# Patient Record
Sex: Female | Born: 1935 | Race: White | Hispanic: No | Marital: Married | Smoking: Never smoker
Health system: Southern US, Community
[De-identification: ages and names within clinical notes are randomized; demographics above are authoritative.]

## PROBLEM LIST (undated history)

## (undated) DIAGNOSIS — C801 Malignant (primary) neoplasm, unspecified: Secondary | ICD-10-CM

## (undated) DIAGNOSIS — T8859XA Other complications of anesthesia, initial encounter: Secondary | ICD-10-CM

## (undated) DIAGNOSIS — R011 Cardiac murmur, unspecified: Secondary | ICD-10-CM

## (undated) DIAGNOSIS — J189 Pneumonia, unspecified organism: Secondary | ICD-10-CM

## (undated) DIAGNOSIS — Z950 Presence of cardiac pacemaker: Secondary | ICD-10-CM

## (undated) DIAGNOSIS — K219 Gastro-esophageal reflux disease without esophagitis: Secondary | ICD-10-CM

## (undated) DIAGNOSIS — I10 Essential (primary) hypertension: Secondary | ICD-10-CM

## (undated) DIAGNOSIS — Z9889 Other specified postprocedural states: Secondary | ICD-10-CM

## (undated) DIAGNOSIS — R112 Nausea with vomiting, unspecified: Secondary | ICD-10-CM

## (undated) DIAGNOSIS — IMO0001 Reserved for inherently not codable concepts without codable children: Secondary | ICD-10-CM

## (undated) DIAGNOSIS — F419 Anxiety disorder, unspecified: Secondary | ICD-10-CM

## (undated) DIAGNOSIS — D649 Anemia, unspecified: Secondary | ICD-10-CM

## (undated) DIAGNOSIS — T4145XA Adverse effect of unspecified anesthetic, initial encounter: Secondary | ICD-10-CM

## (undated) DIAGNOSIS — G629 Polyneuropathy, unspecified: Secondary | ICD-10-CM

## (undated) DIAGNOSIS — K5790 Diverticulosis of intestine, part unspecified, without perforation or abscess without bleeding: Secondary | ICD-10-CM

## (undated) DIAGNOSIS — I209 Angina pectoris, unspecified: Secondary | ICD-10-CM

## (undated) HISTORY — PX: CATARACT EXTRACTION W/ INTRAOCULAR LENS  IMPLANT, BILATERAL: SHX1307

## (undated) HISTORY — PX: JOINT REPLACEMENT: SHX530

## (undated) HISTORY — PX: BREAST SURGERY: SHX581

## (undated) HISTORY — PX: BREAST BIOPSY: SHX20

## (undated) HISTORY — PX: EYE SURGERY: SHX253

## (undated) HISTORY — PX: ABDOMINAL HYSTERECTOMY: SHX81

## (undated) HISTORY — PX: TONSILLECTOMY: SUR1361

## (undated) HISTORY — PX: CHOLECYSTECTOMY: SHX55

---

## 2008-02-24 HISTORY — PX: HERNIA REPAIR: SHX51

## 2012-08-07 ENCOUNTER — Emergency Department: Payer: Self-pay | Admitting: Emergency Medicine

## 2012-08-07 LAB — URINALYSIS, COMPLETE
Bacteria: NONE SEEN
Bilirubin,UR: NEGATIVE
Glucose,UR: NEGATIVE mg/dL (ref 0–75)
Protein: NEGATIVE
Specific Gravity: 1.021 (ref 1.003–1.030)
WBC UR: 2 /HPF (ref 0–5)

## 2012-08-07 LAB — COMPREHENSIVE METABOLIC PANEL
Albumin: 3.6 g/dL (ref 3.4–5.0)
Alkaline Phosphatase: 64 U/L (ref 50–136)
BUN: 22 mg/dL — ABNORMAL HIGH (ref 7–18)
Bilirubin,Total: 0.4 mg/dL (ref 0.2–1.0)
Calcium, Total: 9.4 mg/dL (ref 8.5–10.1)
Chloride: 107 mmol/L (ref 98–107)
Creatinine: 0.85 mg/dL (ref 0.60–1.30)
EGFR (Non-African Amer.): >60
Osmolality: 279 (ref 275–301)
Potassium: 4.4 mmol/L (ref 3.5–5.1)
SGOT(AST): 15 U/L (ref 15–37)
Sodium: 138 mmol/L (ref 136–145)
Total Protein: 7.4 g/dL (ref 6.4–8.2)

## 2012-08-07 LAB — CBC
MCH: 33.3 pg (ref 26.0–34.0)
MCHC: 33.6 g/dL (ref 32.0–36.0)
Platelet: 199 10*3/uL (ref 150–440)
RDW: 14.8 % — ABNORMAL HIGH (ref 11.5–14.5)

## 2012-08-09 LAB — URINALYSIS, COMPLETE
Bacteria: NONE SEEN
Bilirubin,UR: NEGATIVE
Blood: NEGATIVE
Ketone: NEGATIVE
Leukocyte Esterase: NEGATIVE
Nitrite: NEGATIVE
Ph: 6 (ref 4.5–8.0)
Protein: NEGATIVE
Specific Gravity: 1.006 (ref 1.003–1.030)

## 2012-08-09 LAB — CBC WITH DIFFERENTIAL/PLATELET
Basophil #: 0.1 10*3/uL (ref 0.0–0.1)
Basophil %: 0.7 %
Eosinophil #: 0.4 10*3/uL (ref 0.0–0.7)
HGB: 12 g/dL (ref 12.0–16.0)
Lymphocyte #: 2.7 10*3/uL (ref 1.0–3.6)
Lymphocyte %: 22.2 %
MCH: 33 pg (ref 26.0–34.0)
MCHC: 33.2 g/dL (ref 32.0–36.0)
MCV: 99 fL (ref 80–100)
Monocyte #: 1 x10 3/mm — ABNORMAL HIGH (ref 0.2–0.9)
Neutrophil #: 8 10*3/uL — ABNORMAL HIGH (ref 1.4–6.5)
Neutrophil %: 65.2 %
RDW: 14.7 % — ABNORMAL HIGH (ref 11.5–14.5)
WBC: 12.2 10*3/uL — ABNORMAL HIGH (ref 3.6–11.0)

## 2012-08-10 ENCOUNTER — Inpatient Hospital Stay: Payer: Self-pay | Admitting: Specialist

## 2012-08-10 DIAGNOSIS — I059 Rheumatic mitral valve disease, unspecified: Secondary | ICD-10-CM

## 2012-08-10 LAB — COMPREHENSIVE METABOLIC PANEL
Albumin: 3.3 g/dL — ABNORMAL LOW (ref 3.4–5.0)
Alkaline Phosphatase: 50 U/L (ref 50–136)
Anion Gap: 5 — ABNORMAL LOW (ref 7–16)
BUN: 28 mg/dL — ABNORMAL HIGH (ref 7–18)
Bilirubin,Total: 0.5 mg/dL (ref 0.2–1.0)
Calcium, Total: 9.1 mg/dL (ref 8.5–10.1)
Chloride: 106 mmol/L (ref 98–107)
Creatinine: 1.26 mg/dL (ref 0.60–1.30)
Osmolality: 277 (ref 275–301)
Potassium: 4.9 mmol/L (ref 3.5–5.1)
SGOT(AST): 23 U/L (ref 15–37)
SGPT (ALT): 18 U/L (ref 12–78)
Sodium: 136 mmol/L (ref 136–145)

## 2012-08-10 LAB — TROPONIN I
Troponin-I: 0.02 ng/mL
Troponin-I: <0.02 ng/mL
Troponin-I: <0.02 ng/mL

## 2012-08-10 LAB — CK TOTAL AND CKMB (NOT AT ARMC)
CK, Total: 30 U/L (ref 21–215)
CK, Total: 33 U/L (ref 21–215)
CK-MB: 0.7 ng/mL (ref 0.5–3.6)
CK-MB: 0.8 ng/mL (ref 0.5–3.6)
CK-MB: 0.9 ng/mL (ref 0.5–3.6)

## 2012-08-11 LAB — MAGNESIUM: Magnesium: 2.3 mg/dL

## 2012-08-11 LAB — CBC WITH DIFFERENTIAL/PLATELET
Basophil #: 0 10*3/uL (ref 0.0–0.1)
Basophil %: 0.6 %
Eosinophil #: 0.4 10*3/uL (ref 0.0–0.7)
Eosinophil %: 6.2 %
HCT: 34.7 % — ABNORMAL LOW (ref 35.0–47.0)
HGB: 12.1 g/dL (ref 12.0–16.0)
Lymphocyte #: 1.8 10*3/uL (ref 1.0–3.6)
Lymphocyte %: 25.8 %
MCH: 34.1 pg — ABNORMAL HIGH (ref 26.0–34.0)
MCHC: 34.7 g/dL (ref 32.0–36.0)
Neutrophil #: 4 10*3/uL (ref 1.4–6.5)
Platelet: 196 10*3/uL (ref 150–440)
RDW: 14.6 % — ABNORMAL HIGH (ref 11.5–14.5)

## 2012-08-11 LAB — BASIC METABOLIC PANEL
Anion Gap: 6 — ABNORMAL LOW (ref 7–16)
Calcium, Total: 9.2 mg/dL (ref 8.5–10.1)
Creatinine: 0.94 mg/dL (ref 0.60–1.30)
EGFR (African American): >60
Osmolality: 278 (ref 275–301)
Potassium: 4.3 mmol/L (ref 3.5–5.1)

## 2012-08-11 LAB — LIPID PANEL
HDL Cholesterol: 31 mg/dL — ABNORMAL LOW (ref 40–60)
VLDL Cholesterol, Calc: 21 mg/dL (ref 5–40)

## 2012-08-12 LAB — WOUND AEROBIC CULTURE

## 2012-08-13 LAB — CULTURE, BLOOD (SINGLE)

## 2013-06-13 ENCOUNTER — Ambulatory Visit: Payer: Self-pay | Admitting: Internal Medicine

## 2013-08-11 ENCOUNTER — Emergency Department: Payer: Self-pay | Admitting: Emergency Medicine

## 2013-08-11 LAB — BASIC METABOLIC PANEL
ANION GAP: 6 — AB (ref 7–16)
BUN: 25 mg/dL — AB (ref 7–18)
CALCIUM: 9.5 mg/dL (ref 8.5–10.1)
CHLORIDE: 102 mmol/L (ref 98–107)
CREATININE: 0.96 mg/dL (ref 0.60–1.30)
Co2: 27 mmol/L (ref 21–32)
GFR CALC NON AF AMER: 57 — AB
GLUCOSE: 92 mg/dL (ref 65–99)
Osmolality: 274 (ref 275–301)
Potassium: 3.7 mmol/L (ref 3.5–5.1)
SODIUM: 135 mmol/L — AB (ref 136–145)

## 2013-08-11 LAB — CBC WITH DIFFERENTIAL/PLATELET
BASOS PCT: 0.6 %
Basophil #: 0.1 10*3/uL (ref 0.0–0.1)
EOS ABS: 0.4 10*3/uL (ref 0.0–0.7)
EOS PCT: 4.1 %
HCT: 42.3 % (ref 35.0–47.0)
HGB: 13.7 g/dL (ref 12.0–16.0)
LYMPHS ABS: 2.7 10*3/uL (ref 1.0–3.6)
LYMPHS PCT: 27.7 %
MCH: 32.2 pg (ref 26.0–34.0)
MCHC: 32.3 g/dL (ref 32.0–36.0)
MCV: 100 fL (ref 80–100)
MONOS PCT: 6 %
Monocyte #: 0.6 x10 3/mm (ref 0.2–0.9)
NEUTROS ABS: 6 10*3/uL (ref 1.4–6.5)
NEUTROS PCT: 61.6 %
Platelet: 209 10*3/uL (ref 150–440)
RBC: 4.24 10*6/uL (ref 3.80–5.20)
RDW: 15 % — AB (ref 11.5–14.5)
WBC: 9.7 10*3/uL (ref 3.6–11.0)

## 2013-08-11 LAB — TROPONIN I: Troponin-I: <0.02 ng/mL

## 2013-09-22 ENCOUNTER — Other Ambulatory Visit: Payer: Self-pay | Admitting: Cardiology

## 2013-09-22 LAB — TROPONIN I: Troponin-I: <0.02 ng/mL

## 2013-10-19 ENCOUNTER — Emergency Department: Payer: Self-pay | Admitting: Emergency Medicine

## 2013-10-19 LAB — COMPREHENSIVE METABOLIC PANEL
AST: 19 U/L (ref 15–37)
Albumin: 3.5 g/dL (ref 3.4–5.0)
Alkaline Phosphatase: 66 U/L
Anion Gap: 10 (ref 7–16)
BUN: 23 mg/dL — ABNORMAL HIGH (ref 7–18)
Bilirubin,Total: 0.3 mg/dL (ref 0.2–1.0)
Calcium, Total: 9.4 mg/dL (ref 8.5–10.1)
Chloride: 106 mmol/L (ref 98–107)
Co2: 23 mmol/L (ref 21–32)
Creatinine: 0.88 mg/dL (ref 0.60–1.30)
EGFR (African American): >60
EGFR (Non-African Amer.): >60
Glucose: 101 mg/dL — ABNORMAL HIGH (ref 65–99)
OSMOLALITY: 281 (ref 275–301)
POTASSIUM: 3.9 mmol/L (ref 3.5–5.1)
SGPT (ALT): 22 U/L
Sodium: 139 mmol/L (ref 136–145)
Total Protein: 7.3 g/dL (ref 6.4–8.2)

## 2013-10-19 LAB — CBC
HCT: 46.8 % (ref 35.0–47.0)
HGB: 15.3 g/dL (ref 12.0–16.0)
MCH: 32.8 pg (ref 26.0–34.0)
MCHC: 32.8 g/dL (ref 32.0–36.0)
MCV: 100 fL (ref 80–100)
Platelet: 238 10*3/uL (ref 150–440)
RBC: 4.67 10*6/uL (ref 3.80–5.20)
RDW: 15.1 % — AB (ref 11.5–14.5)
WBC: 10.8 10*3/uL (ref 3.6–11.0)

## 2013-10-19 LAB — TROPONIN I: Troponin-I: <0.02 ng/mL

## 2014-03-26 HISTORY — PX: INSERT / REPLACE / REMOVE PACEMAKER: SUR710

## 2014-06-15 NOTE — Discharge Summary (Signed)
PATIENT NAME:  Vicki Raymond, Vicki Raymond MR#:  [id number] DATE OF BIRTH:  [birth year]  DATE OF ADMISSION:  08/10/2012 DATE OF DISCHARGE:  08/13/2012  For a detailed note, please take a look at the history and physical done on admission by Dr. Margaretmary Eddy.   DIAGNOSES AT DISCHARGE:  Is as follows:  1.  Systemic inflammatory response syndrome secondary to lower extremity cellulitis and abscess.  2.  Cellulitis and abscess secondary to MSSA of the lower extremities bilaterally, status post incision and drainage.  3.  History of gout.  4.  History of hyperlipidemia.  5.  History of neuropathy.  6.  History of hypertension.   DISCHARGE DIET:  The patient is being discharged on a low sodium, low fat diet.   ACTIVITY:  As tolerated.   FOLLOW-UP:  With Dr. Marlyce Huge in the next 1 to 2 weeks.    DISCHARGE MEDICATIONS:  Atorvastatin 10 mg daily, gabapentin 100 mg 3 times daily, allopurinol 300 mg daily, Plavix 75 mg daily, vitamin D2 50,000 international units weekly, chondroitin glucosamine 1 tab daily, Centrum multivitamin daily, vitamin B12 1000 mcg 5 tabs daily, telmisartan 20 mg daily, Tylenol with hydrocodone 5/325 every 4 hours as needed for pain and Bactrim double strength 1 tab twice daily for 10 days.   CONSULTANTS DURING HOSPITAL COURSE:  Dr. Marlyce Huge from general surgery.   PERTINENT STUDIES DONE DURING THE HOSPITAL COURSE:  Are as follows:  A CT of the head done without contrast on admission showing no acute intracranial abnormality.  A chest x-ray done on June 18th showing no acute cardiopulmonary disease.  An ultrasound of the carotids showing no evidence of any hemodynamically significant carotid artery stenosis.  A 2-dimensional echocardiogram showing left ventricular ejection fraction to be 55% and 60%, normal LV function, mildly increased left ventricular septal thickness, mildly dilated left atrium, mild mitral valve regurgitation.   HOSPITAL COURSE:  This is a  81 year old female with medical problems as mentioned above, presented to the hospital secondary to worsening of lower extremity cellulitis and altered mental status and lethargy.  1.  Systemic inflammatory response syndrome secondary to the cellulitis and abscess on her lower extremities bilaterally.  The patient was empirically started on broad-spectrum IV antibiotics with IV vancomycin.  A surgical consult was obtained to further evaluate her wounds.  The patient was taken for incision and drainage done by Dr. Rexene Edison on the lower extremity cellulitis and wound on June 19th.  Both the right lower extremity and left lower extremity wounds were packed and a Penrose drain was placed.  The patient's wound cultures have grew out methicillin sensitive staph aureus, therefore she was taken off IV vancomycin, switched to oral Bactrim.  She currently is being discharged on the oral Bactrim for the next 10 days with close follow-up with general surgery as an outpatient.  2.  Bilateral lower extremity cellulitis and abscess formation.  The patient is status post incision and drainage on June 19th.  Post surgery the patient's clinical symptoms have improved.  She is afebrile.  Her hemodynamics have improved.  She was empirically initially on IV vancomycin, but has been switched over to by mouth Bactrim and will continue by mouth therapy for the next 10 days.  3.  Altered mental status.  The patient presented with some lethargy and altered mental status.  This was likely secondary to metabolic encephalopathy from her sepsis and SIRS.  She was worked up for possible CVA with a CT head, a carotid duplex and  an echocardiogram.  All of which were negative.  Her mental status has now returned to baseline.  4.  Bradycardia.  The patient seems to have low heart rates on off unit telemetry monitor.  She has been asymptomatic.  As per family this has been chronic in nature.  This can be further followed as an outpatient.  The  patient to be referred to Cardiology on outapatient basis.  5.  Neuropathy.  The patient was maintained on her Neurontin.  She will resume that.  6.  Hypertension.  Initially her antihypertensives were held, but she will resume her telmisartan upon discharge.  7.  Hyperlipidemia.  The patient was maintained on atorvastatin.  She will resume that upon discharge.  8.  History of gout.  She had no acute gout attack.  She will continue her allopurinol as stated.   CODE STATUS:  The patient is a FULL CODE.   Time spent on discharge is 40 minutes.    ____________________________ Belia Heman. Verdell Carmine, MD vjs:ea D: 08/13/2012 15:35:36 ET T: 08/14/2012 00:27:27 ET JOB#: [id number]  cc: Belia Heman. Verdell Carmine, MD, <Dictator> Christopher A. Rexene Edison, MD Henreitta Leber MD ELECTRONICALLY SIGNED 08/26/2012 15:10

## 2014-06-15 NOTE — Consult Note (Signed)
PATIENT NAME:  Vicki Raymond, THOMA MR#:  [id number] DATE OF BIRTH:  1934-01-15  DATE OF CONSULTATION:  08/11/2012  REFERRING PHYSICIAN:   CONSULTING PHYSICIAN:  Ebonique Hallstrom A. Joya Willmott, MD  REASON FOR CONSULTATION:  Left lower extremity and right lower extremity abscesses.   HISTORY OF PRESENT ILLNESS:  The patient is a pleasant 81 year old female with history of hyperlipidemia, hypertension, and neuropathy who presents for worsening lower extremity lesions. According to her, she said approximately one week ago, she had insect bites because she enjoys gardening and noticed rashes on her left lower extremity in the thigh and then her right calf lateral. She believes she scratched the insect bites and then they became worsening. She was seen here approximately 4 days ago and was discharged on Bactrim, and since then has been getting worse. She did have the lateral one drained in the ED and has worsening of lower extremity erythema. She also was admitted for confusion which it sounds like has resolved and has been worked up. She is currently on IV vancomycin. Otherwise, no fevers, chills, night sweats, shortness of breath, cough, chest pain, abdominal pain, nausea, vomiting, diarrhea, constipation, dysuria, or hematuria.   PAST MEDICAL HISTORY:  Hypertension, hyperlipidemia, neuropathy. A history of hysterectomy for uterine cancer, a history of breast cancer status post partial mastectomy,  cholecystectomy.   ALLERGIES:  KEFLEX, PENICILLIN AND CORN.  HOME MEDICATIONS: 1.  Vitamin D. 2. Telmisartan 3.  Gabapentin.  4.  Plavix.  5.  Chondroitin and glucosamine.  6.  Norvasc.  7.  Atorvastatin.  8.  Allopurinol.   PSYCHOSOCIAL HISTORY:  Lives at home with husband. Denies tobacco or alcohol use.   FAMILY HISTORY:  A history of hypertension.   REVIEW OF SYSTEMS:  A 12-point review of systems was obtained. Pertinent positives and negatives as above.   PHYSICAL EXAMINATION:  VITAL SIGNS:   Temperature 98, pulse 49, blood pressure 146/73, respirations 18, 93% on room air.  GENERAL:  No acute distress. Alert and oriented x 3.  HEAD:  Normocephalic, atraumatic.  EYES:  No scleral icterus. No conjunctivitis.  FACE:  No obvious facial trauma. Normal external nose. Normal external ears. ABDOMEN:  Soft, nontender, nondistended.  EXTREMITIES:  Has an approximately 4 x 4 cm area of induration with a small chronic center with an incision with obvious purulence in her left upper thigh. Also has an approximately 5 x 5 cm indurated lesion with necrotic tissue and possible purulence underneath in her right lower calf. No lesions elsewhere. Strength 4/5 in all 4 extremities.  SENSATION:  Cranial nerves II through XII intact. Sensation intact in all 4 extremities.   LABORATORY, DIAGNOSTIC, AND RADIOLOGICAL DATA:  Currently significant for white cell count of 7.1, hemoglobin and hematocrit, platelets all within normal limits. Cultures of the wounds show Staphylococcus. BMP is normal.   IMAGING:  None.  ASSESSMENT AND PLAN:  The patient is a pleasant 81 year old female with 2 indurated areas which could be abscesses. One has been drained, but it does not appear to be adequately drained.  PLAN:  I have suggested possible incision and drainage of right calf wound and possible increase in left thigh incision to allow it to heal better. I have suggested either local IV lidocaine infiltration versus Operating Room with moderate sedation and she is to discuss these options. If the plan is for sedation tomorrow, we will make her n.p.o. after midnight.  ____________________________ Glena Norfolk. Doreen Garretson, MD cal:jm D: 08/11/2012 16:44:00 ET T: 08/11/2012 18:01:32 ET JOB#: [id number]  cc: Harrell Gave A. Paulmichael Schreck, MD, <Dictator> Floyde Parkins MD ELECTRONICALLY SIGNED 08/13/2012 17:26

## 2014-06-15 NOTE — H&P (Signed)
PATIENT NAME:  Vicki Raymond, Vicki Raymond MR#:  [id number] DATE OF BIRTH:  April 27, 1933  DATE OF ADMISSION:  08/10/2012  REFERRING PHYSICIAN:  Dr. Owens Shark.   PRIMARY CARE PHYSICIAN:  Nonlocal.   CHIEF COMPLAINT:  Worsening of the redness of the lower extremities.   HISTORY OF PRESENT ILLNESS:  The patient is a 81 year old Caucasian female with a past medical history of hyperlipidemia, hypertension and neuropathy, is brought into the ER for worsening of the lower extremity redness.  The patient had insect bites and following that she noticed red-colored rash on both lower extremities.  The patient was seen by ER physician, Dr. Beather Arbour on June 15 and received IV vancomycin, one dose.  She was discharged home with by mouth Bactrim.  Although the patient has been using by mouth Bactrim, there is no significant improvement.  The patient comes to the ER regarding worsening of the redness on her lower extremities, though she is on Bactrim.  According to the patient's son, the patient is confused for one day and having some speech difficulties.  Denies any headache or blurry vision.   PAST MEDICAL HISTORY:  Hypertension, hyperlipidemia, neuropathy.   PAST SURGICAL HISTORY:  Hysterectomy for uterine cancer, breast cancer lumpectomy, cholecystectomy.     ALLERGIES:  Keflex,Penicillin and corn   HOME MEDICATIONS:  Vitamin D2 50,000 international units 1 capsule only one time a week, telmisartan 40 mg 1 tablet once daily, gabapentin 100 mg 1 capsule by mouth 3 times a day, Plavix 75 mg once daily, chondroitin/glucosamine 1 tablet once a day, amlodipine 2.5 mg 1 tablet once a day, atorvastatin 10 mg once daily, allopurinol 300 mg by mouth once a day.   PSYCHOSOCIAL HISTORY:  Lives at home with husband.  Denies any history of smoking, alcohol or illicit drug usage.   FAMILY HISTORY:  Dad has a history of hypertension.   REVIEW OF SYSTEMS:  CONSTITUTIONAL:  Denies any fever, fatigue, weight loss or weight gain.  EYES:   Denies any blurry vision, glaucoma, cataracts.  EARS, NOSE, THROAT:  No tinnitus, discharge, epistaxis, postnasal drip, difficulty in swallowing, asthma.  RESPIRATORY:  Denies any cough, wheezing, COPD.  CARDIOVASCULAR:  No chest pain, orthopnea, edema or syncope.  Positive syncope.  No palpitations.  GASTROINTESTINAL:  Denies nausea, vomiting, diarrhea, abdominal pain, hematemesis.  GENITOURINARY:    PHYSICAL EXAMINATION:  GENERAL APPEARANCE:  Not under acute distress, moderately built and moderately nourished.  HEENT:  Normocephalic, atraumatic.  Pupils are equally reacting to light and accommodation.  No scleral icterus.  No conjunctival injection.  NECK:  Supple.  No JVD.  No thyromegaly.  LUNGS:  Clear to auscultation bilaterally.  No accessory muscle usage.  No anterior chest wall tenderness on palpation.  CARDIAC:  S1, S2 normal.  Regular rate and rhythm.  No murmurs.  No gallops.   GASTROINTESTINAL:  Soft.  Bowel sounds are positive in all 4 quadrants.  Nontender, nondistended.  No masses felt.  No hepatosplenomegaly.  NEUROLOGIC:  Awake, alert, oriented x 3.  Motor and sensory are grossly intact.  Reflexes are 2+.  EXTREMITIES:  No edema.  No cyanosis.  No clubbing and no cerebellar signs. GENITOURINARY:  Positive dysuria, positive hematuria.  No renal calculi.   HEMATOLOGY:  Denies any history of HIV. ENDOCRINOLOGY:  No polyuria, nocturia, thyroid problems.   LABORATORY DATA AND IMAGING STUDIES:  Urinalysis, straw in color, clear in appearance, nitrites and leukocyte esterase are negative.  Glucose 87, BUN 28, creatinine 1.26, sodium 136, potassium 4.8, chloride  106, CO2 25.  LFTs: Total protein 7.0, albumin 3.3, , ALT 23, AST 18.   ASSESSMENT AND PLAN:  A 81 year old female presenting to the ER for worsening of lower extremity cellulitis, will be admitted with the following assessment and plan.  1.  Bilateral lower extremity cellulitis.  We will admit the patient under observation  status, and we will provide her vancomycin in the IV form.  2.  Transient ischemic attack, symptoms are completely resolved.  Cerebrovascular accident is ruled out.  We will provide the patient aspirin and statin. Work up 3.  Hypertension.  Blood pressure is on the lower end. Hold BP meds.   Blood cultures were obtained on June 15th which are pending at this time.  We will continue close monitoring of the patient. 4.  Hyperlipidemia, continue statin.   Total time spent on the admission is 45 minutes.     ____________________________ Nicholes Mango, MD ag:ea D: 08/10/2012 02:28:26 ET T: 08/10/2012 04:17:17 ET JOB#: [id number]  cc: Nicholes Mango, MD, <Dictator> Nicholes Mango MD ELECTRONICALLY SIGNED 08/10/2012 6:57

## 2014-06-15 NOTE — Op Note (Signed)
PATIENT NAME:  [name], Vicki Raymond MR#:  [id number] DATE OF BIRTH:  06/10/1933  DATE OF PROCEDURE:  08/12/2012  ATTENDING PHYSICIAN: Harrell Gave A. Tamy Accardo, MD  PREOPERATIVE DIAGNOSIS: Left thigh and right calf abscesses.  POSTOPERATIVE DIAGNOSIS: Left thigh and right calf abscesses.  PROCEDURE PERFORMED: Incision, drainage and debridement of left thigh abscess, approximately 10 x 4 cm, and right calf abscess. approximately 2 x 2 cm.   ESTIMATED BLOOD LOSS: 25 mL.   COMPLICATIONS: None.   SPECIMENS: None.   ANESTHESIA: 1% lidocaine local and IV sedation.   INDICATION FOR SURGERY: [name] is a pleasant 81 year old female with left thigh and right calf abscesses which have gotten worse despite outpatient antibiotic therapy. I was consulted for management of abscesses.   DETAILS OF PROCEDURE: As follows: Informed consent was obtained. [name] was brought to the operating room suite. She was laid supine on the operating room table. She was given IV sedation. Her left thigh and right calf were prepped and draped in standard surgical fashion. A timeout was then performed, correctly identifying the patient name, operative site and procedure to be performed. I then addressed her left thigh wound first. There were areas of necrosis around the opening which I debrided, with minimal bleeding. I then placed a hemostat and found the cavity to be quite large and extending mediolaterally. I irrigated the cavity and then placed 2 counterincisions and Penrose drains to allow this cavity to drain and allow it to heal without need for packing. The Penrose drains were sutured in place with 3-0 nylon. A sterile dressing and paper tape were then placed over the wound. The right calf wound was then addressed. I used 1% lidocaine for local anesthesia. I then made an incision. There was a large amount of purulence. There was also some necrotic tissue at the center. I opened up this cavity and removed all purulence  and debrided all necrotic tissue sharply with a knife and then irrigated it. I then packed the wound with iodine-soaked Kerlix and placed a sterile dressing over it. The patient was then awakened and brought to the postanesthesia care unit. There were no immediate complications. Needle, sponge and instrument counts were correct at the end of the procedure.   ____________________________ Glena Norfolk. Kemberly Taves, MD cal:OSi D: 08/12/2012 10:45:06 ET T: 08/12/2012 10:55:30 ET JOB#: [id number]  cc: Harrell Gave A. Tondra Reierson, MD, <Dictator> Floyde Parkins MD ELECTRONICALLY SIGNED 08/13/2012 17:25

## 2014-07-02 ENCOUNTER — Other Ambulatory Visit: Payer: Self-pay

## 2014-07-03 ENCOUNTER — Other Ambulatory Visit: Payer: Self-pay

## 2014-07-03 ENCOUNTER — Encounter
Admission: RE | Admit: 2014-07-03 | Discharge: 2014-07-03 | Disposition: A | Discharge status: Home / Self Care | Payer: Medicare Other | Source: Ambulatory Visit | Attending: Cardiology | Admitting: Cardiology

## 2014-07-03 ENCOUNTER — Ambulatory Visit
Admission: RE | Admit: 2014-07-03 | Discharge: 2014-07-03 | Disposition: A | Discharge status: Home / Self Care | Payer: Medicare Other | Source: Ambulatory Visit | Attending: Cardiology | Admitting: Cardiology

## 2014-07-03 VITALS — BP 95/43 | HR 50 | Ht 69.5 in | Wt 193.0 lb

## 2014-07-03 DIAGNOSIS — Z01810 Encounter for preprocedural cardiovascular examination: Secondary | ICD-10-CM | POA: Diagnosis present

## 2014-07-03 DIAGNOSIS — Z01812 Encounter for preprocedural laboratory examination: Secondary | ICD-10-CM | POA: Diagnosis present

## 2014-07-03 DIAGNOSIS — I10 Essential (primary) hypertension: Secondary | ICD-10-CM

## 2014-07-03 DIAGNOSIS — I517 Cardiomegaly: Secondary | ICD-10-CM | POA: Insufficient documentation

## 2014-07-03 HISTORY — DX: Diverticulosis of intestine, part unspecified, without perforation or abscess without bleeding: K57.90

## 2014-07-03 HISTORY — DX: Anemia, unspecified: D64.9

## 2014-07-03 HISTORY — DX: Essential (primary) hypertension: I10

## 2014-07-03 HISTORY — DX: Cardiac murmur, unspecified: R01.1

## 2014-07-03 HISTORY — DX: Anxiety disorder, unspecified: F41.9

## 2014-07-03 HISTORY — DX: Reserved for inherently not codable concepts without codable children: IMO0001

## 2014-07-03 HISTORY — DX: Gastro-esophageal reflux disease without esophagitis: K21.9

## 2014-07-03 HISTORY — DX: Polyneuropathy, unspecified: G62.9

## 2014-07-03 HISTORY — DX: Malignant (primary) neoplasm, unspecified: C80.1

## 2014-07-03 HISTORY — DX: Angina pectoris, unspecified: I20.9

## 2014-07-03 LAB — CBC
HCT: 40.5 % (ref 35.0–47.0)
HEMOGLOBIN: 13.3 g/dL (ref 12.0–16.0)
MCH: 32.9 pg (ref 26.0–34.0)
MCHC: 32.9 g/dL (ref 32.0–36.0)
MCV: 100.1 fL — AB (ref 80.0–100.0)
Platelets: 197 10*3/uL (ref 150–440)
RBC: 4.05 MIL/uL (ref 3.80–5.20)
RDW: 15.3 % — ABNORMAL HIGH (ref 11.5–14.5)
WBC: 8 10*3/uL (ref 3.6–11.0)

## 2014-07-03 LAB — DIFFERENTIAL
Basophils Absolute: 0.1 10*3/uL (ref 0–0.1)
Basophils Relative: 1 %
Eosinophils Absolute: 0.3 10*3/uL (ref 0–0.7)
Eosinophils Relative: 4 %
LYMPHS ABS: 2.9 10*3/uL (ref 1.0–3.6)
LYMPHS PCT: 36 %
Monocytes Absolute: 0.6 10*3/uL (ref 0.2–0.9)
Monocytes Relative: 8 %
NEUTROS PCT: 51 %
Neutro Abs: 4.2 10*3/uL (ref 1.4–6.5)

## 2014-07-03 LAB — PROTIME-INR
INR: 1.01
PROTHROMBIN TIME: 13.5 s (ref 11.4–15.0)

## 2014-07-03 LAB — BASIC METABOLIC PANEL
Anion gap: 7 (ref 5–15)
BUN: 22 mg/dL — ABNORMAL HIGH (ref 6–20)
CHLORIDE: 103 mmol/L (ref 101–111)
CO2: 27 mmol/L (ref 22–32)
Calcium: 9.5 mg/dL (ref 8.9–10.3)
Creatinine, Ser: 0.67 mg/dL (ref 0.44–1.00)
GFR calc non Af Amer: >60 mL/min (ref >60)
Glucose, Bld: 84 mg/dL (ref 65–99)
POTASSIUM: 4.2 mmol/L (ref 3.5–5.1)
SODIUM: 137 mmol/L (ref 135–145)

## 2014-07-03 LAB — APTT: APTT: 26 s (ref 24–36)

## 2014-07-04 MED ORDER — VANCOMYCIN HCL IN DEXTROSE 1-5 GM/200ML-% IV SOLN: 1000.0000 mg | Freq: Once | INTRAVENOUS | Status: DC

## 2014-07-04 NOTE — H&P (Signed)
Progress Notes   Vicki Raymond (MR# [id number])        Progress Notes Info     Author Note Status Last Update User Last Update Date/Time   Sydnee Levans, MD Signed Sydnee Levans, MD Thu Jun 28, 2014 4:45 PM    Progress Notes    Expand All Collapse All      Chief Complaint: Chief Complaint  Patient presents with  . 4 week follow up    I am doing more. heart rate in 40-50 rates.   . Hypertension    150's/ ? she cant remember  . Edema    is better  . Dizziness    if i get up to quick. son says she is still orthostatic   Date of Service: 06/28/2014 Date of Birth: 1933-05-21 PCP: Rusty Aus, MD  History of Present Illness: Vicki Raymond is a 81 y.o.female patient who returns for follow-up visit. Has a history of hypertension treated with amlodipine, losartan and furosemide. Has intermittent episodes of bradycardia. She also has frequent episodes of dizziness. These can occur when standing up. The also can occur when sitting. She denies syncope. She still drives and assess with her husband who has Alzheimer's disease. Has heart rates in the 40s and 50s much of the time. Had an event monitor documenting this. Discussion regarding permanent pacemaker for symptomatic bradycardia was discussed and risk and benefits were discussed with the patient and her son. Past Medical and Surgical History  Past Medical History Past Medical History  Diagnosis Date  . Vitamin D deficiency disease 07/19/2013  . HTN (hypertension) 07/19/2013  . B12 deficiency 07/19/2013  . Gout 07/19/2013  . Peripheral neuropathy 07/19/2013  . Aortic stenosis, mild 08/04/2013    6/15  . Syncope and collapse   . Stroke   . Anemia   . Cardiac murmur   . Cervical cancer     postop 2004  . 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   Medications and Allergies  Current Medications   Current Medications    Current Outpatient Prescriptions  Medication Sig Dispense Refill  . amLODIPine (NORVASC) 2.5 MG tablet Take 2 tablets (5 mg total) by mouth once daily. 30 tablet 6  . aspirin 81 MG EC tablet Take 81 mg by mouth once daily.    . Compound Medication Estriol 1mg /g Use 1/2 applicator per vagina 3 times weekly for 2 wks Disp 30 g tube 1 each 0  . cyanocobalamin (VITAMIN B12) 1000 MCG tablet Take 1,000 mcg by mouth once daily.    . ergocalciferol (DRISDOL) 50,000 unit capsule 50,000 unit    . FUROsemide (LASIX) 20 MG tablet Take 20 mg by mouth every morning.    . gabapentin (NEURONTIN) 100 MG capsule TAKE 1 CAPSULE BY MOUTH 3 TIMES DAILY. 270 capsule 3  . losartan (COZAAR) 50 MG tablet Take 1 tablet (50 mg total) by mouth nightly. 30 tablet 6  . mirabegron (MYRBETRIQ) 25 mg Tb24 Take 1 tablet (25 mg total) by mouth once daily. 30 tablet 11  . omeprazole (PRILOSEC) 20 MG DR capsule TAKE 1 CAPSULE BY MOUTH EVERY MORNING, 30 MINUTES BEFORE BREAKFAST. 90 capsule 3  . sertraline (ZOLOFT) 25 MG tablet TAKE 1 TABLET BY MOUTH ONCE DAILY. 90 tablet 3  . solifenacin (VESICARE) 10 MG tablet Take 1  tablet (10 mg total) by mouth once daily. 30 tablet 6   No current facility-administered medications for this visit.      Allergies: Corn; Other; Ace inhibitors; and Arb-angiotensin receptor antagonist  Social and Family History  Social History  reports that she has never smoked. She has never used smokeless tobacco. She reports that she does not drink alcohol or use illicit drugs.  Family History Family History  Problem Relation Age of Onset  . Breast cancer Mother   . Colon cancer Father   . Breast cancer Sister     Review of Systems  Review of Systems  Constitutional:  Negative for fever, chills, weight loss, malaise/fatigue and diaphoresis.  HENT: Negative for congestion, ear discharge, hearing loss and tinnitus.  Eyes: Negative for blurred vision.  Respiratory: Positive for shortness of breath. Negative for cough, hemoptysis, sputum production and wheezing.  Cardiovascular: Positive for chest pain. Negative for palpitations, orthopnea, claudication, leg swelling and PND.  Gastrointestinal: Negative for heartburn, nausea, vomiting, abdominal pain, diarrhea, constipation, blood in stool and melena.  Genitourinary: Negative for dysuria, urgency, frequency and hematuria.  Musculoskeletal: Negative for myalgias, back pain, joint pain and falls.  Skin: Negative for itching and rash.  Neurological: Positive for dizziness. Negative for tingling, focal weakness, loss of consciousness, weakness and headaches.  Endo/Heme/Allergies: Negative for polydipsia. Does not bruise/bleed easily.  Psychiatric/Behavioral: Negative for depression, memory loss and substance abuse. The patient is not nervous/anxious.    Physical Examination   Vitals:BP 128/80 mmHg  Pulse 64  Resp 10  Ht 177.8 cm (5\' 10" )  Wt 87.544 kg (193 lb)  BMI 27.69 kg/m2  LMP (LMP Unknown) Ht:177.8 cm (5\' 10" ) Wt:87.544 kg (193 lb) IHK:VQQV surface area is 2.08 meters squared. Body mass index is 27.69 kg/(m^2).  Wt Readings from Last 3 Encounters:  06/28/14 87.544 kg (193 lb)  05/31/14 87.998 kg (194 lb)  05/29/14 87.091 kg (192 lb)    BP Readings from Last 3 Encounters:  06/28/14 128/80  05/31/14 140/78  05/29/14 92/56    General appearance appears in no acute distress  Head Mouth and Eye exam Normocephalic, without obvious abnormality, atraumatic Dentition is good Eyes appear anicteric   Neck exam Thyroid: normal  Nodes: no obvious  adenopathy  LUNGS Breath Sounds: Normal Percussion: Normal  CARDIOVASCULAR JVP CV wave: no HJR: no Elevation at 90 degrees: None Carotid Pulse: normal pulsation bilaterally Bruit: None Apex: apical impulse normal  Auscultation Rhythm: normal sinus rhythm S1: normal S2: normal Clicks: no Rub: no Murmurs: 2/6 medium pitched mid systolic blowing at lower left sternal border  Gallop: None ABDOMEN Liver enlargement: no Pulsatile aorta: no Ascites: no Bruits: no  EXTREMITIES Clubbing: no Edema: trace to 1+ bilateral pedal edema Pulses: peripheral pulses symmetrical Femoral Bruits: no Amputation: no SKIN Rash: no Cyanosis: no Embolic phemonenon: no Bruising: no NEURO Alert and Oriented to person, place and time: yes Non focal: yes  PSYCH: Pt appears to have normal affect   LABS REVIEWED Last 3 CBC results: Lab Results  Component Value Date   WBC 9.2 09/22/2013   Lab Results  Component Value Date   HGB 14.2 09/22/2013   Lab Results  Component Value Date   HCT 42.7 09/22/2013    Lab Results  Component Value Date   PLT 211 09/22/2013    Lab Results  Component Value Date   CREATININE 0.8 09/22/2013   BUN 23 09/22/2013   NA 136 09/22/2013   K 4.3 09/22/2013   CL  102 09/22/2013   CO2 30.9 09/22/2013    Assessment  and Plan   81 y.o. female with    ICD-10-CM ICD-9-CM   1. Aortic stenosis, mild-aortic stenosis appears mild. There is no cortical gradient. Patient does not appear to be symptomatic. Will continue to follow with serial echoes or as symptoms dictate I35.0 424.1   2. Essential hypertension-pressures in better control since decreasing amlodipine. Will continue with this regimen I10 401.9   3. B12 deficiency E53.8 266.2   4. Heart palpitations-currently stable will follow avoiding stimulant R00.2 785.1   5. Bradycardia-appears to have symptomatic bradycardia with documented heart rate in the 40s and 50s. Risk and benefits of permanent pacemaker were discussed with the patient and her son and they agreed to proceed.    Return in about 3 months (around 09/28/2014).   These notes generated with voice recognition software. I apologize for typographical errors.  Sydnee Levans, [facility]   [address] [place] [address]    Service Location    Name Address       [address] [place] [address]      Department    Name Address Phone Fax   [facility] [place] [address] [phone]

## 2014-07-05 ENCOUNTER — Observation Stay: Payer: Medicare Other

## 2014-07-05 ENCOUNTER — Observation Stay
Admission: RE | Admit: 2014-07-05 | Discharge: 2014-07-06 | Disposition: A | Discharge status: Home / Self Care | Payer: Medicare Other | Source: Ambulatory Visit | Attending: Cardiology | Admitting: Cardiology

## 2014-07-05 ENCOUNTER — Ambulatory Visit: Payer: Medicare Other | Admitting: Certified Registered"

## 2014-07-05 ENCOUNTER — Encounter: Payer: Self-pay | Admitting: *Deleted

## 2014-07-05 ENCOUNTER — Ambulatory Visit: Payer: Medicare Other

## 2014-07-05 ENCOUNTER — Encounter
Admission: RE | Disposition: A | Discharge status: Home / Self Care | Payer: Self-pay | Source: Ambulatory Visit | Attending: Cardiology

## 2014-07-05 DIAGNOSIS — Z8541 Personal history of malignant neoplasm of cervix uteri: Secondary | ICD-10-CM | POA: Insufficient documentation

## 2014-07-05 DIAGNOSIS — I350 Nonrheumatic aortic (valve) stenosis: Secondary | ICD-10-CM | POA: Diagnosis not present

## 2014-07-05 DIAGNOSIS — R001 Bradycardia, unspecified: Secondary | ICD-10-CM | POA: Diagnosis present

## 2014-07-05 DIAGNOSIS — Z950 Presence of cardiac pacemaker: Secondary | ICD-10-CM

## 2014-07-05 DIAGNOSIS — I495 Sick sinus syndrome: Secondary | ICD-10-CM | POA: Diagnosis not present

## 2014-07-05 DIAGNOSIS — R011 Cardiac murmur, unspecified: Secondary | ICD-10-CM | POA: Diagnosis not present

## 2014-07-05 DIAGNOSIS — I10 Essential (primary) hypertension: Secondary | ICD-10-CM | POA: Insufficient documentation

## 2014-07-05 DIAGNOSIS — Z8542 Personal history of malignant neoplasm of other parts of uterus: Secondary | ICD-10-CM | POA: Insufficient documentation

## 2014-07-05 DIAGNOSIS — G629 Polyneuropathy, unspecified: Secondary | ICD-10-CM | POA: Diagnosis not present

## 2014-07-05 DIAGNOSIS — Z8673 Personal history of transient ischemic attack (TIA), and cerebral infarction without residual deficits: Secondary | ICD-10-CM | POA: Diagnosis not present

## 2014-07-05 DIAGNOSIS — Z9660 Presence of unspecified orthopedic joint implant: Secondary | ICD-10-CM | POA: Diagnosis not present

## 2014-07-05 DIAGNOSIS — R42 Dizziness and giddiness: Secondary | ICD-10-CM | POA: Diagnosis not present

## 2014-07-05 DIAGNOSIS — M109 Gout, unspecified: Secondary | ICD-10-CM | POA: Insufficient documentation

## 2014-07-05 DIAGNOSIS — E559 Vitamin D deficiency, unspecified: Secondary | ICD-10-CM | POA: Diagnosis not present

## 2014-07-05 DIAGNOSIS — Z79899 Other long term (current) drug therapy: Secondary | ICD-10-CM | POA: Insufficient documentation

## 2014-07-05 HISTORY — PX: PACEMAKER LEAD REMOVAL: SHX5064

## 2014-07-05 SURGERY — REMOVAL, ELECTRODE LEAD, CARDIAC PACEMAKER, WITHOUT REPLACEMENT
Anesthesia: Monitor Anesthesia Care | Wound class: Clean

## 2014-07-05 MED ORDER — FENTANYL CITRATE (PF) 100 MCG/2ML IJ SOLN
INTRAMUSCULAR | Status: AC
  Administered 2014-07-05: 25 ug via INTRAVENOUS
  Filled 2014-07-05: qty 2

## 2014-07-05 MED ORDER — LOSARTAN POTASSIUM 50 MG PO TABS
50.0000 mg | ORAL_TABLET | Freq: Every day | ORAL | Status: DC
Start: 2014-07-05 — End: 2014-07-06
  Administered 2014-07-06: 50 mg via ORAL
  Filled 2014-07-05: qty 1

## 2014-07-05 MED ORDER — FENTANYL CITRATE (PF) 100 MCG/2ML IJ SOLN
25.0000 ug | INTRAMUSCULAR | Status: DC | PRN
  Administered 2014-07-05 (×2): 25 ug via INTRAVENOUS

## 2014-07-05 MED ORDER — MIRABEGRON ER 25 MG PO TB24
25.0000 mg | ORAL_TABLET | Freq: Every day | ORAL | Status: DC
  Administered 2014-07-06: 25 mg via ORAL
  Filled 2014-07-05: qty 1

## 2014-07-05 MED ORDER — SODIUM CHLORIDE 0.9 % IR SOLN
Freq: Once | Status: DC
  Filled 2014-07-05: qty 2

## 2014-07-05 MED ORDER — LIDOCAINE 1 % OPTIME INJ - NO CHARGE
INTRAMUSCULAR | Status: DC | PRN
Start: 2014-07-05 — End: 2014-07-05
  Administered 2014-07-05: 30 mL

## 2014-07-05 MED ORDER — VANCOMYCIN HCL IN DEXTROSE 1-5 GM/200ML-% IV SOLN
1000.0000 mg | Freq: Two times a day (BID) | INTRAVENOUS | Status: AC
  Administered 2014-07-05: 1000 mg via INTRAVENOUS
  Filled 2014-07-05: qty 200

## 2014-07-05 MED ORDER — LIDOCAINE HCL (CARDIAC) 20 MG/ML IV SOLN
INTRAVENOUS | Status: DC | PRN
  Administered 2014-07-05: 100 mg via INTRAVENOUS

## 2014-07-05 MED ORDER — ACETAMINOPHEN 10 MG/ML IV SOLN
INTRAVENOUS | Status: AC
  Administered 2014-07-05: 1000 mg via INTRAVENOUS
  Filled 2014-07-05: qty 100

## 2014-07-05 MED ORDER — AMLODIPINE BESYLATE 5 MG PO TABS
2.5000 mg | ORAL_TABLET | Freq: Once | ORAL | Status: AC
  Administered 2014-07-05: 2.5 mg via ORAL
  Filled 2014-07-05: qty 1

## 2014-07-05 MED ORDER — PROPOFOL 10 MG/ML IV BOLUS
INTRAVENOUS | Status: DC | PRN
  Administered 2014-07-05 (×2): 20 mg via INTRAVENOUS

## 2014-07-05 MED ORDER — PROPOFOL INFUSION 10 MG/ML OPTIME
INTRAVENOUS | Status: DC | PRN
  Administered 2014-07-05: 70 ug/kg/min via INTRAVENOUS

## 2014-07-05 MED ORDER — ONDANSETRON HCL 4 MG/2ML IJ SOLN: 4.0000 mg | Freq: Four times a day (QID) | INTRAMUSCULAR | Status: DC | PRN

## 2014-07-05 MED ORDER — ONDANSETRON HCL 4 MG/2ML IJ SOLN: 4.0000 mg | Freq: Once | INTRAMUSCULAR | Status: DC | PRN

## 2014-07-05 MED ORDER — AMLODIPINE BESYLATE 5 MG PO TABS
5.0000 mg | ORAL_TABLET | Freq: Every day | ORAL | Status: DC
Start: 2014-07-06 — End: 2014-07-06
  Administered 2014-07-06: 5 mg via ORAL
  Filled 2014-07-05: qty 1

## 2014-07-05 MED ORDER — GABAPENTIN 100 MG PO CAPS
100.0000 mg | ORAL_CAPSULE | Freq: Three times a day (TID) | ORAL | Status: DC
  Administered 2014-07-05 – 2014-07-06 (×3): 100 mg via ORAL
  Filled 2014-07-05 (×3): qty 1

## 2014-07-05 MED ORDER — AMLODIPINE BESYLATE 2.5 MG PO TABS
2.5000 mg | ORAL_TABLET | Freq: Every day | ORAL | Status: DC
Start: 2014-07-05 — End: 2014-07-05

## 2014-07-05 MED ORDER — PANTOPRAZOLE SODIUM 40 MG PO TBEC
40.0000 mg | DELAYED_RELEASE_TABLET | Freq: Every day | ORAL | Status: DC
  Administered 2014-07-05: 40 mg via ORAL
  Filled 2014-07-05: qty 1

## 2014-07-05 MED ORDER — ACETAMINOPHEN 325 MG PO TABS: 325.0000 mg | ORAL_TABLET | ORAL | Status: DC | PRN

## 2014-07-05 MED ORDER — SODIUM CHLORIDE 0.9 % IR SOLN
Status: DC | PRN
  Administered 2014-07-05: 250 mL

## 2014-07-05 MED ORDER — SODIUM CHLORIDE BACTERIOSTATIC 0.9 % IJ SOLN
INTRAMUSCULAR | Status: DC | PRN
  Administered 2014-07-05: 3 mL

## 2014-07-05 MED ORDER — VANCOMYCIN HCL 1000 MG IV SOLR
Freq: Once | INTRAVENOUS | Status: AC
  Administered 2014-07-05: 12:00:00 via INTRAVENOUS
  Filled 2014-07-05: qty 250

## 2014-07-05 MED ORDER — FUROSEMIDE 20 MG PO TABS
20.0000 mg | ORAL_TABLET | Freq: Every day | ORAL | Status: DC
Start: 2014-07-06 — End: 2014-07-06
  Administered 2014-07-06: 20 mg via ORAL
  Filled 2014-07-05: qty 1

## 2014-07-05 MED ORDER — SODIUM CHLORIDE 0.9 % IV SOLN
INTRAVENOUS | Status: DC
  Administered 2014-07-05 (×2): via INTRAVENOUS

## 2014-07-05 MED ORDER — SODIUM BICARBONATE 4 % IV SOLN
INTRAVENOUS | Status: AC
  Filled 2014-07-05: qty 5

## 2014-07-05 MED ORDER — GENTAMICIN SULFATE 40 MG/ML IJ SOLN
INTRAMUSCULAR | Status: AC
  Filled 2014-07-05: qty 2

## 2014-07-05 MED ORDER — SERTRALINE HCL 25 MG PO TABS
25.0000 mg | ORAL_TABLET | Freq: Every day | ORAL | Status: DC
  Administered 2014-07-05 – 2014-07-06 (×2): 25 mg via ORAL
  Filled 2014-07-05 (×2): qty 1

## 2014-07-05 SURGICAL SUPPLY — 30 items
BAG DECANTER STRL (MISCELLANEOUS) ×3 IMPLANT
BENZOIN TINCTURE PRP APPL 2/3 (GAUZE/BANDAGES/DRESSINGS) ×3 IMPLANT
BRUSH SCRUB 4% CHG (MISCELLANEOUS) ×3 IMPLANT
CANISTER SUCT 1200ML W/VALVE (MISCELLANEOUS) ×3 IMPLANT
CHLORAPREP W/TINT 26ML (MISCELLANEOUS) ×3 IMPLANT
CLOSURE WOUND 1/4X4 (GAUZE/BANDAGES/DRESSINGS) ×1
COVER LIGHT HANDLE STERIS (MISCELLANEOUS) ×6 IMPLANT
COVER MAYO STAND STRL (DRAPES) IMPLANT
DRAPE C-ARM XRAY 36X54 (DRAPES) ×3 IMPLANT
DRESSING TELFA 4X3 1S ST N-ADH (GAUZE/BANDAGES/DRESSINGS) ×3 IMPLANT
DRSG TEGADERM 4X4.75 (GAUZE/BANDAGES/DRESSINGS) ×3 IMPLANT
GLOVE BIO SURGEON STRL SZ7.5 (GLOVE) ×6 IMPLANT
GLOVE BIO SURGEON STRL SZ8 (GLOVE) ×6 IMPLANT
GOWN STRL REUS W/ TWL LRG LVL3 (GOWN DISPOSABLE) ×2 IMPLANT
GOWN STRL REUS W/TWL LRG LVL3 (GOWN DISPOSABLE) ×4
IMMOBILIZER SHDR MD LX WHT (SOFTGOODS) ×3 IMPLANT
IMMOBILIZER SHDR XL LX WHT (SOFTGOODS) IMPLANT
IV NS 500ML (IV SOLUTION) ×2
IV NS 500ML BAXH (IV SOLUTION) ×1 IMPLANT
KIT RM TURNOVER STRD PROC AR (KITS) ×3 IMPLANT
LABEL OR SOLS (LABEL) ×3 IMPLANT
LEAD CAPSURE NOVUS 5076-52CM (Lead) ×3 IMPLANT
LEAD CAPSURE NOVUS 5076-58CM (Lead) ×3 IMPLANT
MARKER SKIN W/RULER 31145785 (MISCELLANEOUS) ×3 IMPLANT
PACK PACE INSERTION (MISCELLANEOUS) ×3 IMPLANT
PAD GROUND ADULT SPLIT (MISCELLANEOUS) ×3 IMPLANT
PAD STATPAD (MISCELLANEOUS) ×3 IMPLANT
PPM ADVISA MRI DR A2DR01 (Pacemaker) ×3 IMPLANT
STRIP CLOSURE SKIN 1/4X4 (GAUZE/BANDAGES/DRESSINGS) ×2 IMPLANT
SUT SILK 0 SH 30 (SUTURE) ×9 IMPLANT

## 2014-07-05 NOTE — Op Note (Signed)
[facility]   07/05/2014                     1:33 PM  PATIENT:  Vicki Raymond    PRE-OPERATIVE DIAGNOSIS:  SICK SINUS SYNDROME  POST-OPERATIVE DIAGNOSIS:  Same  PROCEDURE:  Pacemaker insertion   SURGEON:  Jaelee Laughter, MD    ANESTHESIA:   The patient received propofol drip  PREOPERATIVE INDICATIONS:  Barbarann Kelly is a  81 y.o. female with a diagnosis of SICK SINUS SYNDROME who failed conservative measures and elected for surgical management.  Patient has history of persistent sinus bradycardia, with occasional heart rates in the 40s and 50s, recurrent episodes of dizziness.  The risks benefits and alternatives were discussed with the patient preoperatively including but not limited to the risks of infection, bleeding, cardiopulmonary complications, the need for revision surgery, among others, and the patient was willing to proceed.   OPERATIVE PROCEDURE: The patient was brought to the operating room the fasting state. The left pectoral region was prepped and draped in the usual standard manner. Local anesthesia was obtained with 1% Xylocaine locally. A 6 cm incision was performed of the left pectoral region the pacemaker pocket was generated by electrocautery and blunt dissection access was obtained to less subclavian vein by fine needle aspiration. MRI compatible leads were positioned into the right ventricular apical septum and right atrial appendage under fluoroscopic guidance. After proper thresholds were obtained the leads were sutured in place. Leads were connected to a MRI compatible dual-chamber rate responsive pacemaker generator (Medtronic A2 DRO 1). The pacemaker pocket was irrigated with gentamicin solution. History degenerates positioned in the pocket and pocket was closed with 20 and 4-0 Vicryl, respectively. Steri-Strips and pressure dressing were applied

## 2014-07-05 NOTE — Anesthesia Preprocedure Evaluation (Addendum)
Anesthesia Evaluation  Patient identified by MRN, date of birth, ID band Patient awake    Reviewed: Allergy & Precautions, H&P , NPO status , Patient's Chart, lab work & pertinent test results  Airway Mallampati: III  TM Distance: >3 FB Neck ROM: Full    Dental  (+) Chipped   Pulmonary          Cardiovascular hypertension, + angina + Valvular Problems/Murmurs Rate:Abnormal     Neuro/Psych  Neuromuscular disease    GI/Hepatic GERD-  ,  Endo/Other    Renal/GU   negative genitourinary   Musculoskeletal   Abdominal   Peds negative pediatric ROS (+)  Hematology  (+) anemia ,   Anesthesia Other Findings   Reproductive/Obstetrics                          Anesthesia Physical Anesthesia Plan  ASA: III  Anesthesia Plan: MAC   Post-op Pain Management:    Induction:   Airway Management Planned: Nasal Cannula  Additional Equipment:   Intra-op Plan:   Post-operative Plan:   Informed Consent: I have reviewed the patients History and Physical, chart, labs and discussed the procedure including the risks, benefits and alternatives for the proposed anesthesia with the patient or authorized representative who has indicated his/her understanding and acceptance.   Dental advisory given  Plan Discussed with: CRNA and Surgeon  Anesthesia Plan Comments:        Anesthesia Quick Evaluation

## 2014-07-05 NOTE — Transfer of Care (Signed)
Immediate Anesthesia Transfer of Care Note  Patient: Vicki Raymond  Procedure(s) Performed: Procedure(s): Pacemaker insertion  (N/A)  Patient Location: PACU  Anesthesia Type:MAC  Level of Consciousness: awake, alert  and oriented  Airway & Oxygen Therapy: Patient Spontanous Breathing and Patient connected to nasal cannula oxygen  Post-op Assessment: Report given to RN and Post -op Vital signs reviewed and stable  Post vital signs: Reviewed and stable  Last Vitals:  Filed Vitals:   07/05/14 1328  BP: 108/61  Pulse:   Temp: 36.7 C  Resp: 21    Complications: No apparent anesthesia complications

## 2014-07-05 NOTE — Anesthesia Postprocedure Evaluation (Signed)
  Anesthesia Post-op Note  Patient: Vicki Raymond  Procedure(s) Performed: Procedure(s): Pacemaker insertion  (N/A)  Anesthesia type:MAC  Patient location: PACU  Post pain: Pain level controlled  Post assessment: Post-op Vital signs reviewed, Patient's Cardiovascular Status Stable, Respiratory Function Stable, Patent Airway and No signs of Nausea or vomiting  Post vital signs: Reviewed and stable  Last Vitals:  Filed Vitals:   07/05/14 1512  BP: 176/68  Pulse: 60  Temp: 36.6 C  Resp: 19    Level of consciousness: awake, alert  and patient cooperative  Complications: No apparent anesthesia complications

## 2014-07-05 NOTE — Progress Notes (Signed)
Patient admitted this afternoon from PACU. Bedside report from PACU RN, Raquel Sarna. Resting quietly. Assessment as charted. No pain or complaints at this time. Will continue to monitor.  Toniann Ket

## 2014-07-06 DIAGNOSIS — I495 Sick sinus syndrome: Secondary | ICD-10-CM | POA: Diagnosis not present

## 2014-07-06 MED ORDER — CLARITHROMYCIN 250 MG PO TABS: 250.0000 mg | ORAL_TABLET | Freq: Two times a day (BID) | ORAL | Status: DC

## 2014-07-06 MED ORDER — AMLODIPINE BESYLATE 5 MG PO TABS: 5.0000 mg | ORAL_TABLET | Freq: Every day | ORAL | Status: DC

## 2014-07-06 MED ORDER — CLARITHROMYCIN 250 MG PO TABS
250.0000 mg | ORAL_TABLET | Freq: Two times a day (BID) | ORAL | Status: DC
  Administered 2014-07-06: 250 mg via ORAL
  Filled 2014-07-06 (×2): qty 1

## 2014-07-06 NOTE — Progress Notes (Signed)
Pt discharged to home.. Pacer site clean and dry, no drainage.   HR approx 60 on monitor, pt A&OX4, IV istes DCd, tele monitor turned in, pt verbalized understanding re f/u appts and meds.  patient left hospital in car with her family

## 2014-07-06 NOTE — Discharge Summary (Signed)
Physician Discharge Summary  Patient ID: Vicki Raymond MRN: [id number] DOB/AGE: January 06, 1934 81 y.o.  Admit date: 07/05/2014 Discharge date: 07/06/2014  Primary Discharge Diagnosis sinus bradycardia Secondary Discharge Diagnosis hypertension  Significant Diagnostic Studies:  Consults:   Hospital Course: The patient underwent elective dual-chamber pacemaker implantation on 07/05/14. Patient received MRI compatible leads and dual-chamber pacemaker generator. There were no complications. The patient is admitted to telemetry following the procedure. Eventually revealed predominant with ventricular sensing and pacing. The patient had an uncomplicated hospital course. Blood pressure was elevated. Amlodipine was increased to 5 mg daily, and Cozaar was increased to daily. On the morning of 07/06/14 the patient was doing well, ambulating with that without difficulty, and was discharged home. Her pacemaker site appeared to be healing.   Discharge Exam: Blood pressure 178/65, pulse 60, temperature 97.9 F (36.6 C), temperature source Oral, resp. rate 16, height 5' 9.5" (1.765 m), weight 85.821 kg (189 lb 3.2 oz), SpO2 94 %.   General appearance: alert Head: atraumatic Eyes: conjunctivae/corneas clear. PERRL, EOM's intact. Fundi benign. Ears: normal TM's and external ear canals both ears Nose: Nares normal. Septum midline. Mucosa normal. No drainage or sinus tenderness. Chest wall: no tenderness Cardio: regular rate and rhythm, S1, S2 normal, no murmur, click, rub or gallop GI: soft, non-tender; bowel sounds normal; no masses,  no organomegaly Extremities: extremities normal, atraumatic, no cyanosis or edema Pulses: 2+ and symmetric Neurologic: Grossly normal Labs:   Lab Results  Component Value Date   WBC 8.0 07/03/2014   HGB 13.3 07/03/2014   HCT 40.5 07/03/2014   MCV 100.1* 07/03/2014   PLT 197 07/03/2014    Recent Labs Lab 07/03/14 1005  NA 137  K 4.2  CL 103  CO2 27  BUN  22*  CREATININE 0.67  CALCIUM 9.5  GLUCOSE 84      Radiology: Chest x-ray did not reveal evidence for pneumothorax EKG: Atrial and ventricular sensing  FOLLOW UP PLANS AND APPOINTMENTS    Medication List    TAKE these medications        amLODipine 5 MG tablet  Commonly known as:  NORVASC  Take 1 tablet (5 mg total) by mouth daily.     aspirin EC 81 MG tablet  Take 81 mg by mouth daily.     clarithromycin 250 MG tablet  Commonly known as:  BIAXIN  Take 1 tablet (250 mg total) by mouth every 12 (twelve) hours.     cyanocobalamin 100 MCG tablet  Take 1,000 mcg by mouth daily.     furosemide 20 MG tablet  Commonly known as:  LASIX  Take 20 mg by mouth daily.     gabapentin 100 MG capsule  Commonly known as:  NEURONTIN  Take 100 mg by mouth 3 (three) times daily.     losartan 25 MG tablet  Commonly known as:  COZAAR  Take 25 mg by mouth daily.     multivitamin tablet  Take 1 tablet by mouth daily.     omeprazole 20 MG capsule  Commonly known as:  PRILOSEC  Take 20 mg by mouth daily.     sertraline 25 MG tablet  Commonly known as:  ZOLOFT  Take 25 mg by mouth daily.     Vitamin D (Ergocalciferol) 50000 UNITS Caps capsule  Commonly known as:  DRISDOL  Take 50,000 Units by mouth every 7 (seven) days.         BRING ALL MEDICATIONS WITH YOU TO FOLLOW UP APPOINTMENTS  Time spent with patient to include physician time: 25 minutes Signed:  Isaias Cowman MD, PhD, [facility] 07/06/2014, 8:28 AM

## 2014-07-06 NOTE — Discharge Instructions (Signed)
Pacemaker Battery Change °A pacemaker battery usually lasts 4 to 12 years. Once or twice per year, you will be asked to visit your health care provider to have a full evaluation of your pacemaker. When a battery needs to be replaced, the entire pacemaker is replaced so that you can benefit from new circuitry and any new features that have been added to pacemakers. Most often, this procedure is very simple because the leads are already in place.  °There are many things that affect how long a pacemaker battery will last, including:  °· The age of the pacemaker.   °· The number of leads (1, 2, or 3).   °· The pacemaker work load. If the pacemaker is helping the heart more often, the battery will not last as long as it would if the pacemaker did not need to help the heart.   °· Power (voltage) settings.  °LET YOUR HEALTH CARE PROVIDER KNOW ABOUT:  °· Any allergies you have.   °· All medicines you are taking, including vitamins, herbs, eye drops, creams, and over-the-counter medicines.   °· Previous problems you or members of your family have had with the use of anesthetics.   °· Any blood disorders you have.   °· Previous surgeries you have had, especially since your last pacemaker placement.   °· Medical conditions you have.   °· Possibility of pregnancy, if this applies. °· Symptoms of chest pain, trouble breathing, palpitations, light-headedness, or feelings of an abnormal or irregular heartbeat. °RISKS AND COMPLICATIONS  °Generally, this is a safe procedure. However, as with any procedure, problems can occur and include:  °· Bleeding.   °· Bruising of the skin around where the incision was made.   °· Pain at the incision site.   °· Pulling apart of the skin at the incision site.   °· Infection.   °· Allergic reaction to anesthetics or other medicines used during the procedure.   °People with diabetes may have a temporary increase in their blood sugar after any surgical procedure.  °BEFORE THE PROCEDURE  °· Wash all  of the skin around the area of the chest where the pacemaker is located.   °· Ask your health care provider for help with any medicine adjustments before the pacemaker is replaced.   °· Do not eat or drink anything after midnight on the night before the procedure or as directed by your health care provider. °· Ask your health care provider if you can take a sip of water with any approved medicines the morning of the procedure. °PROCEDURE  °· After giving medicine to numb the skin (local anesthetic), your health care provider will make a cut to reopen the pocket holding the pacemaker.   °· The old pacemaker will be disconnected from its leads.   °· The leads will be tested.   °· If needed, the leads will be replaced. If the leads are functioning properly, the new pacemaker may be connected to the existing leads. °· A heart monitor and the pacemaker programmer will be used to make sure that the new pacemaker is working properly. °· The incision site will then be closed. A dressing will be placed over the pacemaker site. The dressing will be removed 24-48 hours afterward. °AFTER THE PROCEDURE  °· You will be taken to a recovery area after the new pacemaker implant is completed. Your vital signs such as blood pressure, heart rate, breathing, and oxygen levels will be monitored. °· Your health care provider will tell you when you will need to next test your pacemaker or when to return to the office for follow-up   for removal of stitches. °Document Released: 05/20/2006 Document Revised: 06/26/2013 Document Reviewed: 08/24/2012 °Exit[employer]® Patient Information ©2015 [employer]. This information is not intended to replace advice given to you by your health care provider. Make sure you discuss any questions you have with your health care provider. ° °

## 2014-07-08 ENCOUNTER — Encounter: Payer: Self-pay | Admitting: Cardiology

## 2014-10-04 ENCOUNTER — Other Ambulatory Visit: Payer: Self-pay | Admitting: Internal Medicine

## 2014-10-04 DIAGNOSIS — Z1231 Encounter for screening mammogram for malignant neoplasm of breast: Secondary | ICD-10-CM

## 2014-10-09 ENCOUNTER — Ambulatory Visit: Payer: Medicare Other | Attending: Internal Medicine

## 2015-01-14 ENCOUNTER — Ambulatory Visit: Payer: Medicare Other | Admitting: Anesthesiology

## 2015-01-14 ENCOUNTER — Encounter
Admission: RE | Disposition: A | Discharge status: Home / Self Care | Payer: Self-pay | Source: Ambulatory Visit | Attending: Unknown Physician Specialty

## 2015-01-14 ENCOUNTER — Ambulatory Visit
Admission: RE | Admit: 2015-01-14 | Discharge: 2015-01-14 | Disposition: A | Discharge status: Home / Self Care | Payer: Medicare Other | Source: Ambulatory Visit | Attending: Unknown Physician Specialty | Admitting: Unknown Physician Specialty

## 2015-01-14 DIAGNOSIS — Z1211 Encounter for screening for malignant neoplasm of colon: Secondary | ICD-10-CM | POA: Insufficient documentation

## 2015-01-14 DIAGNOSIS — Z8542 Personal history of malignant neoplasm of other parts of uterus: Secondary | ICD-10-CM | POA: Diagnosis not present

## 2015-01-14 DIAGNOSIS — Z90710 Acquired absence of both cervix and uterus: Secondary | ICD-10-CM | POA: Insufficient documentation

## 2015-01-14 DIAGNOSIS — I10 Essential (primary) hypertension: Secondary | ICD-10-CM | POA: Diagnosis not present

## 2015-01-14 DIAGNOSIS — Z96642 Presence of left artificial hip joint: Secondary | ICD-10-CM | POA: Diagnosis not present

## 2015-01-14 DIAGNOSIS — Z86010 Personal history of colonic polyps: Secondary | ICD-10-CM | POA: Diagnosis present

## 2015-01-14 DIAGNOSIS — Z7982 Long term (current) use of aspirin: Secondary | ICD-10-CM | POA: Diagnosis not present

## 2015-01-14 DIAGNOSIS — K449 Diaphragmatic hernia without obstruction or gangrene: Secondary | ICD-10-CM | POA: Diagnosis not present

## 2015-01-14 DIAGNOSIS — Z79899 Other long term (current) drug therapy: Secondary | ICD-10-CM | POA: Diagnosis not present

## 2015-01-14 DIAGNOSIS — K219 Gastro-esophageal reflux disease without esophagitis: Secondary | ICD-10-CM | POA: Insufficient documentation

## 2015-01-14 DIAGNOSIS — D649 Anemia, unspecified: Secondary | ICD-10-CM | POA: Diagnosis not present

## 2015-01-14 DIAGNOSIS — Z950 Presence of cardiac pacemaker: Secondary | ICD-10-CM | POA: Diagnosis not present

## 2015-01-14 DIAGNOSIS — F419 Anxiety disorder, unspecified: Secondary | ICD-10-CM | POA: Diagnosis not present

## 2015-01-14 DIAGNOSIS — K5730 Diverticulosis of large intestine without perforation or abscess without bleeding: Secondary | ICD-10-CM | POA: Diagnosis not present

## 2015-01-14 DIAGNOSIS — Z8249 Family history of ischemic heart disease and other diseases of the circulatory system: Secondary | ICD-10-CM | POA: Diagnosis not present

## 2015-01-14 DIAGNOSIS — K640 First degree hemorrhoids: Secondary | ICD-10-CM | POA: Diagnosis not present

## 2015-01-14 DIAGNOSIS — G629 Polyneuropathy, unspecified: Secondary | ICD-10-CM | POA: Insufficient documentation

## 2015-01-14 DIAGNOSIS — Z800 Family history of malignant neoplasm of digestive organs: Secondary | ICD-10-CM | POA: Insufficient documentation

## 2015-01-14 HISTORY — PX: ESOPHAGOGASTRODUODENOSCOPY (EGD) WITH PROPOFOL: SHX5813

## 2015-01-14 HISTORY — DX: Pneumonia, unspecified organism: J18.9

## 2015-01-14 HISTORY — PX: COLONOSCOPY WITH PROPOFOL: SHX5780

## 2015-01-14 SURGERY — COLONOSCOPY WITH PROPOFOL
Anesthesia: General

## 2015-01-14 MED ORDER — PROPOFOL 10 MG/ML IV BOLUS
INTRAVENOUS | Status: DC | PRN
  Administered 2015-01-14: 50 mg via INTRAVENOUS

## 2015-01-14 MED ORDER — SODIUM CHLORIDE 0.9 % IV SOLN: INTRAVENOUS | Status: DC

## 2015-01-14 MED ORDER — SODIUM CHLORIDE 0.9 % IV SOLN
INTRAVENOUS | Status: DC
  Administered 2015-01-14: 1000 mL via INTRAVENOUS

## 2015-01-14 MED ORDER — LACTATED RINGERS IV SOLN
INTRAVENOUS | Status: DC | PRN
  Administered 2015-01-14: 09:00:00 via INTRAVENOUS

## 2015-01-14 MED ORDER — PROPOFOL 500 MG/50ML IV EMUL
INTRAVENOUS | Status: DC | PRN
  Administered 2015-01-14: 75 ug/kg/min via INTRAVENOUS

## 2015-01-14 NOTE — Op Note (Signed)
[facility] Patient Name: Vicki Raymond Procedure Date: 01/14/2015 9:09 AM MRN: [id number] Account #: [id number] Date of Birth: November 06, 1933 Admit Type: Inpatient Age: 81 Room: [facility] ENDO ROOM 1 Gender: Female Note Status: Finalized Procedure:         Upper GI endoscopy Indications:       Heartburn, Follow-up of gastro-esophageal reflux disease Providers:         Manya Silvas, MD Referring MD:      Rusty Aus, MD (Referring MD) Medicines:         Propofol per Anesthesia Complications:     No immediate complications. Procedure:         Pre-Anesthesia Assessment:                    - After reviewing the risks and benefits, the patient was                     deemed in satisfactory condition to undergo the procedure.                    After obtaining informed consent, the endoscope was passed                     under direct vision. Throughout the procedure, the                     patient's blood pressure, pulse, and oxygen saturations                     were monitored continuously. The Endoscope was introduced                     through the mouth, and advanced to the second part of                     duodenum. The upper GI endoscopy was accomplished without                     difficulty. The patient tolerated the procedure well. Findings:      The examined esophagus was normal. GEJ 42-43cm.      A small hiatus hernia was present.      The stomach was otherwise normal.      The examined duodenum was normal. Impression:        - Normal esophagus.                    - Small hiatus hernia.                    - Normal stomach.                    - Normal examined duodenum.                    - No specimens collected. Recommendation:    Continue medication, do colonoscopy Manya Silvas, MD 01/14/2015 10:33:26 AM This report has been signed electronically. Number of Addenda: 0 Note Initiated On: 01/14/2015 9:09 AM      [facility]

## 2015-01-14 NOTE — Transfer of Care (Signed)
Immediate Anesthesia Transfer of Care Note  Patient: Vicki Raymond  Procedure(s) Performed: Procedure(s): COLONOSCOPY WITH PROPOFOL (N/A) ESOPHAGOGASTRODUODENOSCOPY (EGD) WITH PROPOFOL (N/A)  Patient Location: PACU and Endoscopy Unit  Anesthesia Type:General  Level of Consciousness: awake, alert  and oriented  Airway & Oxygen Therapy: Patient Spontanous Breathing and Patient connected to nasal cannula oxygen  Post-op Assessment: Report given to RN and Post -op Vital signs reviewed and stable  Post vital signs: Reviewed and stable  Last Vitals:  Filed Vitals:   01/14/15 0833  BP: 162/77  Pulse: 74  Temp: 36.4 C  Resp: 20    Complications: No apparent anesthesia complications

## 2015-01-14 NOTE — Op Note (Signed)
[facility]enter[facility] Patient Name: Vicki Raymond Procedure Date: 01/14/2015 9:08 AM MRN: [id number] Account #: [id number] Date of Birth: 1933-04-22 Admit Type: Outpatient Age: 81 Room: [facility] ENDO ROOM 1 Gender: Female Note Status: Finalized Procedure:         Colonoscopy Indications:       High risk colon cancer surveillance: Personal history of                     colonic polyps, Family history of colon cancer in a                     first-degree relative Providers:         Manya Silvas, MD Referring MD:      Rusty Aus, MD (Referring MD) Medicines:         Propofol per Anesthesia Complications:     No immediate complications. Procedure:         Pre-Anesthesia Assessment:                    - After reviewing the risks and benefits, the patient was                     deemed in satisfactory condition to undergo the procedure.                    After obtaining informed consent, the colonoscope was                     passed under direct vision. Throughout the procedure, the                     patient's blood pressure, pulse, and oxygen saturations                     were monitored continuously. The Colonoscope was                     introduced through the anus and advanced to the the cecum,                     identified by appendiceal orifice and ileocecal valve. The                     colonoscopy was somewhat difficult due to restricted                     mobility of the colon. The patient tolerated the procedure                     well. The quality of the bowel preparation was excellent. Findings:      Multiple small and large-mouthed diverticula were found in the sigmoid       colon and in the descending colon.      Internal hemorrhoids were found during endoscopy. The hemorrhoids were       small and Grade I (internal hemorrhoids that do not prolapse).      The exam was otherwise without abnormality. Impression:        -  Diverticulosis in the sigmoid colon and in the                     descending colon.                    -  Internal hemorrhoids.                    - The examination was otherwise normal.                    - No specimens collected. Recommendation:    - The findings and recommendations were discussed with the                     patient's family. Due to age no repeat exam needed. Manya Silvas, MD 01/14/2015 9:50:29 AM This report has been signed electronically. Number of Addenda: 0 Note Initiated On: 01/14/2015 9:08 AM Scope Withdrawal Time: 0 hours 8 minutes 16 seconds  Total Procedure Duration: 0 hours 19 minutes 45 seconds       [facility]

## 2015-01-14 NOTE — Anesthesia Preprocedure Evaluation (Signed)
Anesthesia Evaluation  Patient identified by MRN, date of birth, ID band Patient awake    Reviewed: Allergy & Precautions, H&P , NPO status , Patient's Chart, lab work & pertinent test results  Airway Mallampati: III  TM Distance: >3 FB Neck ROM: Full    Dental  (+) Chipped   Pulmonary shortness of breath and with exertion, pneumonia, resolved,    Pulmonary exam normal        Cardiovascular hypertension, + angina + pacemaker + Valvular Problems/Murmurs  Rate:Abnormal  Chest pain HX   Neuro/Psych Anxiety Peripheral neuropathy  Neuromuscular disease    GI/Hepatic GERD  Medicated and Controlled,  Endo/Other    Renal/GU   negative genitourinary   Musculoskeletal   Abdominal Normal abdominal exam  (+)   Peds negative pediatric ROS (+)  Hematology  (+) anemia ,   Anesthesia Other Findings   Reproductive/Obstetrics                             Anesthesia Physical Anesthesia Plan  ASA: III  Anesthesia Plan: General   Post-op Pain Management:    Induction: Intravenous  Airway Management Planned: Nasal Cannula  Additional Equipment:   Intra-op Plan:   Post-operative Plan:   Informed Consent:   Dental advisory given  Plan Discussed with: CRNA and Surgeon  Anesthesia Plan Comments:         Anesthesia Quick Evaluation

## 2015-01-14 NOTE — H&P (Signed)
Primary Care Physician:  Rusty Aus., MD Primary Gastroenterologist:  Dr. Vira Agar  Pre-Procedure History & Physical: HPI:  Vicki Raymond is a 81 y.o. female is here for an endoscopy and colonoscopy.   Past Medical History  Diagnosis Date  . Anginal pain ([place])   . Hypertension   . Heart murmur   . Shortness of breath dyspnea   . Anxiety   . GERD (gastroesophageal reflux disease)   . Cancer [facility])     uterine, endometrial  . Diverticulosis   . Peripheral neuropathy ([place])   . Anemia   . Pneumonia     Past Surgical History  Procedure Laterality Date  . Abdominal hysterectomy    . Cholecystectomy    . Breast surgery Left   . Joint replacement Left     Left hip replacement  . Tonsillectomy    . Pacemaker lead removal N/A 07/05/2014    Procedure: Pacemaker insertion ;  Surgeon: Isaias Cowman, MD;  Location: [facility] ORS;  Service: Cardiovascular;  Laterality: N/A;  . Insert / replace / remove pacemaker    . Eye surgery      Prior to Admission medications   Medication Sig Start Date End Date Taking? Authorizing Provider  amLODipine (NORVASC) 5 MG tablet Take 1 tablet (5 mg total) by mouth daily. 07/06/14   Isaias Cowman, MD  aspirin EC 81 MG tablet Take 81 mg by mouth daily.    Historical Provider, MD  clarithromycin (BIAXIN) 250 MG tablet Take 1 tablet (250 mg total) by mouth every 12 (twelve) hours. 07/06/14   Isaias Cowman, MD  cyanocobalamin 100 MCG tablet Take 1,000 mcg by mouth daily.    Historical Provider, MD  furosemide (LASIX) 20 MG tablet Take 20 mg by mouth daily.    Historical Provider, MD  gabapentin (NEURONTIN) 100 MG capsule Take 100 mg by mouth 3 (three) times daily.    Historical Provider, MD  losartan (COZAAR) 25 MG tablet Take 25 mg by mouth daily.    Historical Provider, MD  Multiple Vitamin (MULTIVITAMIN) tablet Take 1 tablet by mouth daily.    Historical Provider, MD  omeprazole (PRILOSEC) 20 MG capsule Take 20 mg by mouth daily.     Historical Provider, MD  sertraline (ZOLOFT) 25 MG tablet Take 25 mg by mouth daily.    Historical Provider, MD  Vitamin D, Ergocalciferol, (DRISDOL) 50000 UNITS CAPS capsule Take 50,000 Units by mouth every 7 (seven) days.    Historical Provider, MD    Allergies as of 11/01/2014 - Review Complete 07/05/2014  Allergen Reaction Noted  . Corn-containing products Anaphylaxis 07/03/2014  . Keflex [cephalexin] Itching and Rash 07/03/2014    Family History  Problem Relation Age of Onset  . Hypertension Mother   . Hypertension Father   . Hypertension Sister   . Hypertension Brother   . Hypertension Other   . Diabetes Mellitus II Maternal Grandmother     Social History   Social History  . Marital Status: Married    Spouse Name: N/A  . Number of Children: N/A  . Years of Education: N/A   Occupational History  . Not on file.   Social History Main Topics  . Smoking status: Never Smoker   . Smokeless tobacco: Never Used  . Alcohol Use: No  . Drug Use: No  . Sexual Activity: Not on file   Other Topics Concern  . Not on file   Social History Narrative    Review of Systems: See HPI, otherwise negative  ROS  Physical Exam: BP 162/77 mmHg  Pulse 74  Temp(Src) 97.5 F (36.4 C) (Tympanic)  Resp 20  Ht 5\' 9"  (1.753 m)  Wt 88.451 kg (195 lb)  BMI 28.78 kg/m2  SpO2 99% General:   Alert,  pleasant and cooperative in NAD Head:  Normocephalic and atraumatic. Neck:  Supple; no masses or thyromegaly. Lungs:  Clear throughout to auscultation.    Heart:  Regular rate and rhythm. Abdomen:  Soft, nontender and nondistended. Normal bowel sounds, without guarding, and without rebound.   Neurologic:  Alert and  oriented x4;  grossly normal neurologically.  Impression/Plan: [name] is here for an endoscopy and colonoscopy to be performed for [facility] colon polyps, GERD, family history of colon polyps.  Risks, benefits, limitations, and alternatives regarding  endoscopy and  colonoscopy have been reviewed with the patient.  Questions have been answered.  All parties agreeable.   Gaylyn Cheers, MD  01/14/2015, 9:13 AM

## 2015-01-15 ENCOUNTER — Encounter: Payer: Self-pay | Admitting: Unknown Physician Specialty

## 2015-01-18 NOTE — Anesthesia Postprocedure Evaluation (Signed)
Anesthesia Post Note  Patient: Vicki Raymond  Procedure(s) Performed: Procedure(s) (LRB): COLONOSCOPY WITH PROPOFOL (N/A) ESOPHAGOGASTRODUODENOSCOPY (EGD) WITH PROPOFOL (N/A)  Patient location during evaluation: Endoscopy Anesthesia Type: General Level of consciousness: awake, awake and alert and oriented Pain management: pain level controlled Vital Signs Assessment: post-procedure vital signs reviewed and stable Respiratory status: spontaneous breathing Cardiovascular status: blood pressure returned to baseline Anesthetic complications: no    Last Vitals:  Filed Vitals:   01/14/15 1010 01/14/15 1020  BP: 162/77 165/73  Pulse: 69 60  Temp:    Resp: 15 15    Last Pain:  Filed Vitals:   01/15/15 0750  PainSc: 0-No pain                 [name]

## 2015-12-20 ENCOUNTER — Other Ambulatory Visit: Payer: Self-pay | Admitting: Internal Medicine

## 2015-12-20 DIAGNOSIS — N63 Unspecified lump in unspecified breast: Secondary | ICD-10-CM

## 2015-12-26 ENCOUNTER — Ambulatory Visit
Admission: RE | Admit: 2015-12-26 | Discharge: 2015-12-26 | Disposition: A | Discharge status: Home / Self Care | Payer: Medicare Other | Source: Ambulatory Visit | Attending: Internal Medicine | Admitting: Internal Medicine

## 2015-12-26 DIAGNOSIS — N63 Unspecified lump in unspecified breast: Secondary | ICD-10-CM

## 2016-01-03 ENCOUNTER — Other Ambulatory Visit: Payer: Medicare Other

## 2016-01-03 ENCOUNTER — Ambulatory Visit: Payer: Medicare Other

## 2016-06-05 IMAGING — CR DG CHEST 2V
1 series · 2 of 2 positions shown · non-contrast
Comparison: 08/10/2012

CLINICAL DATA: Preop for pacemaker insertion

EXAM:
CHEST  2 VIEW

[Series 1: dg chest 2 view · 0.14mm/px · 2 of 2 slices shown]
[im 1/2]
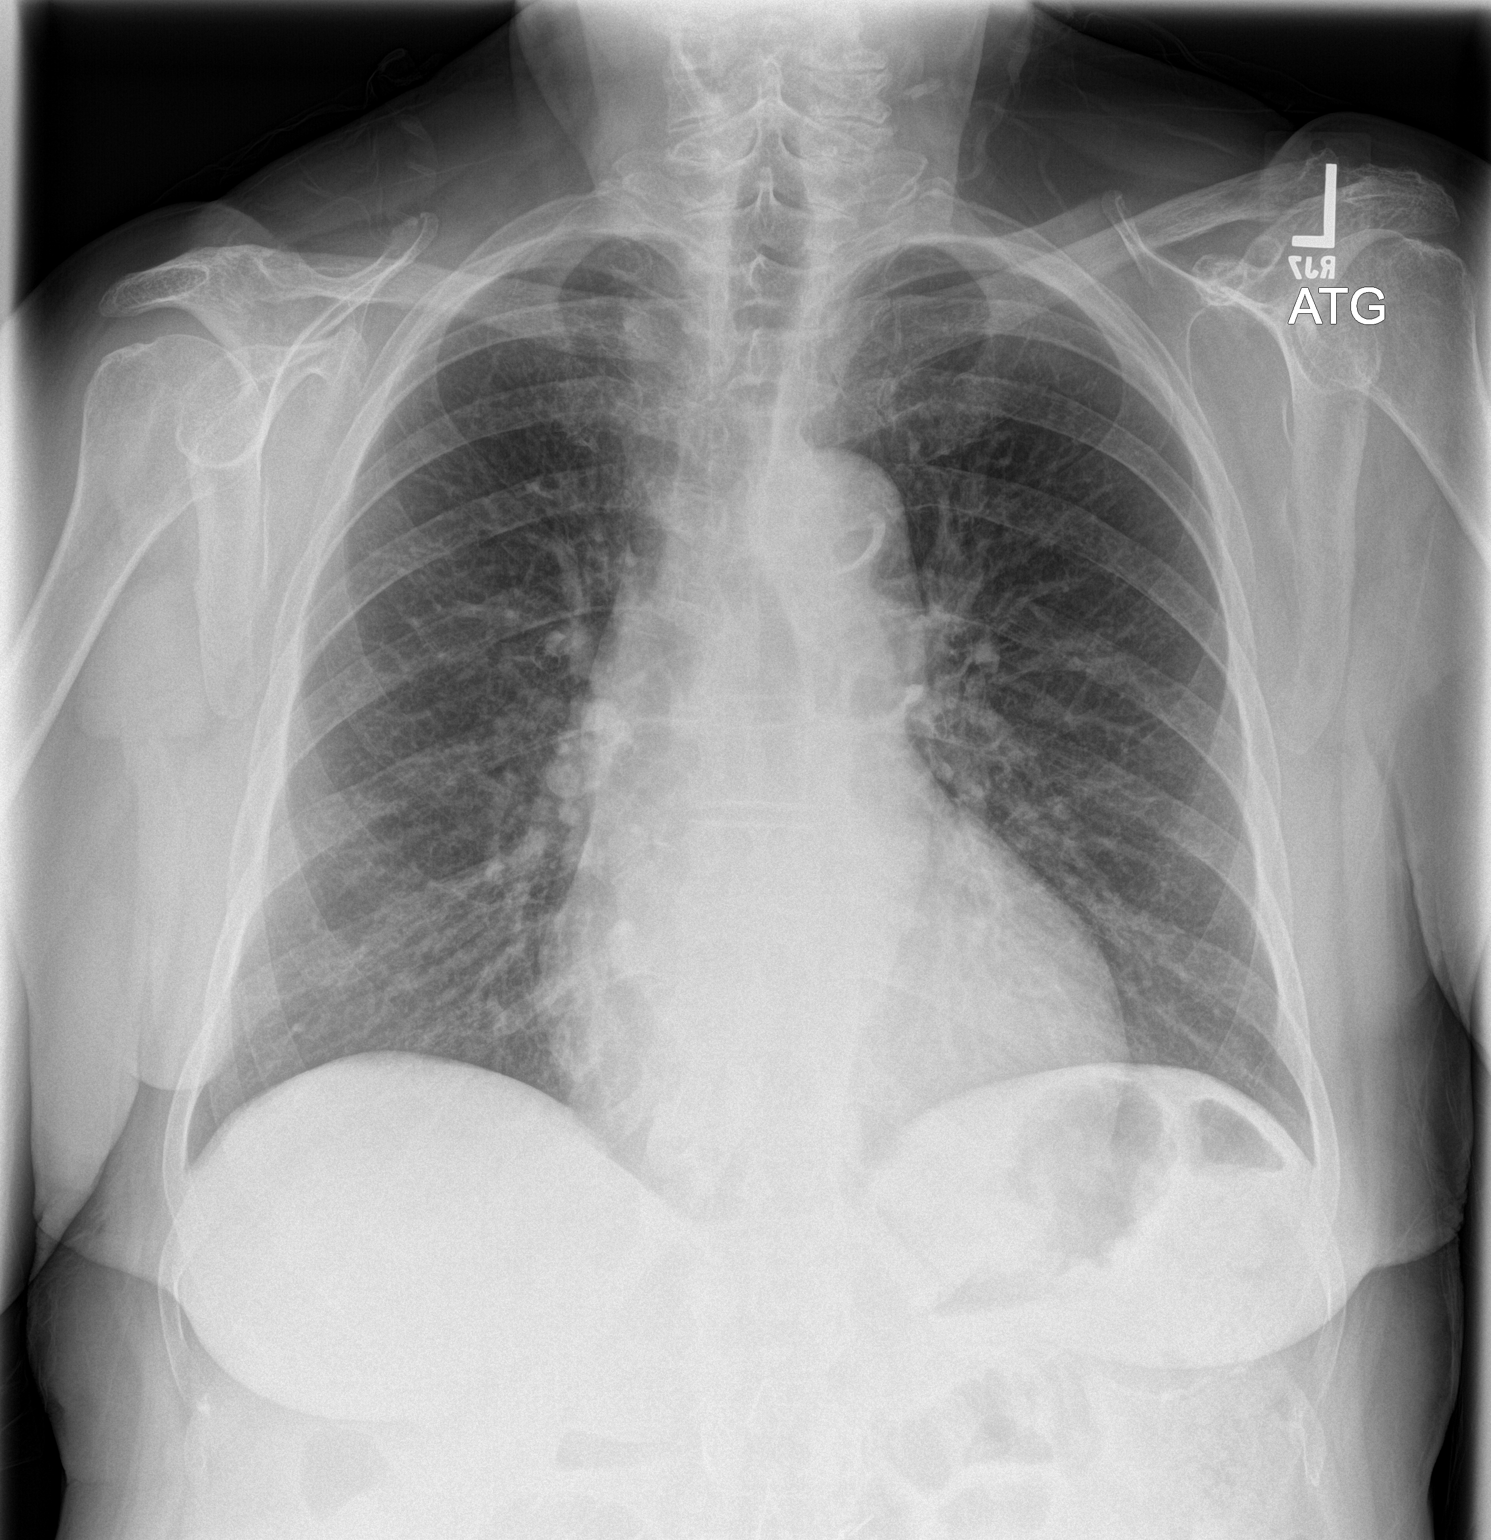
[im 2/2]
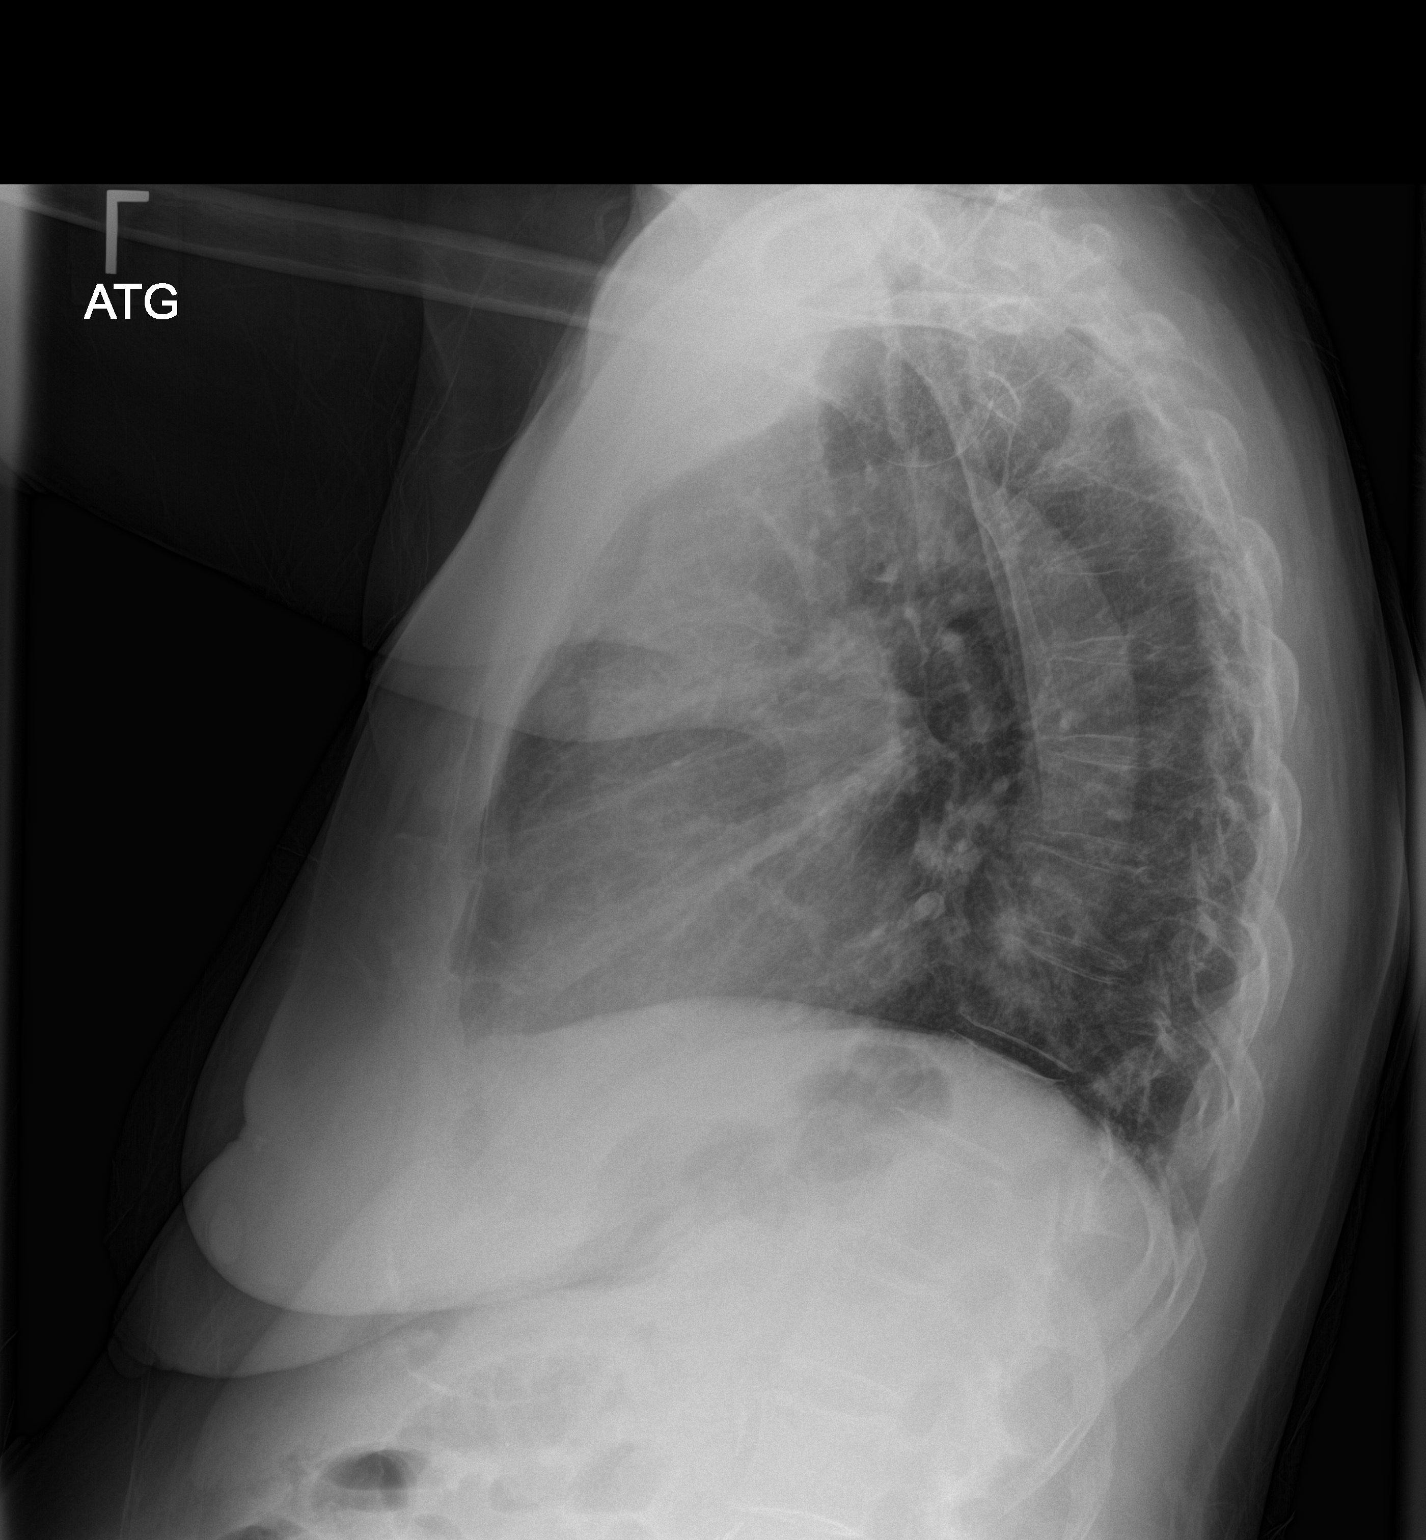

[2 of 2 positions shown; findings below may reference images not displayed]

FINDINGS: Borderline cardiomegaly again noted. No acute infiltrate or pleural
effusion. No pulmonary edema. Atherosclerotic calcifications of
thoracic aorta. Mild thoracic spine osteopenia.
IMPRESSION: Borderline cardiomegaly.  No active disease.

## 2016-09-01 ENCOUNTER — Ambulatory Visit
Admission: RE | Admit: 2016-09-01 | Discharge: 2016-09-01 | Disposition: A | Discharge status: Home / Self Care | Payer: Medicare Other | Source: Ambulatory Visit | Attending: Internal Medicine | Admitting: Internal Medicine

## 2016-09-01 ENCOUNTER — Other Ambulatory Visit: Payer: Self-pay | Admitting: Internal Medicine

## 2016-09-01 DIAGNOSIS — R600 Localized edema: Secondary | ICD-10-CM | POA: Insufficient documentation

## 2016-11-30 ENCOUNTER — Other Ambulatory Visit: Payer: Self-pay | Admitting: Orthopedic Surgery

## 2016-11-30 ENCOUNTER — Ambulatory Visit
Admission: RE | Admit: 2016-11-30 | Discharge: 2016-11-30 | Disposition: A | Discharge status: Home / Self Care | Payer: Medicare Other | Source: Ambulatory Visit | Attending: Orthopedic Surgery | Admitting: Orthopedic Surgery

## 2016-11-30 DIAGNOSIS — M175 Other unilateral secondary osteoarthritis of knee: Secondary | ICD-10-CM | POA: Diagnosis not present

## 2016-11-30 DIAGNOSIS — Z96642 Presence of left artificial hip joint: Secondary | ICD-10-CM | POA: Diagnosis not present

## 2016-11-30 DIAGNOSIS — M2342 Loose body in knee, left knee: Secondary | ICD-10-CM | POA: Diagnosis not present

## 2016-11-30 DIAGNOSIS — M7122 Synovial cyst of popliteal space [Baker], left knee: Secondary | ICD-10-CM | POA: Diagnosis not present

## 2016-12-16 ENCOUNTER — Inpatient Hospital Stay: Admission: RE | Admit: 2016-12-16 | Payer: Medicare Other | Source: Ambulatory Visit

## 2016-12-21 ENCOUNTER — Encounter: Payer: Self-pay | Admitting: Emergency Medicine

## 2016-12-21 ENCOUNTER — Emergency Department: Payer: Medicare Other

## 2016-12-21 ENCOUNTER — Emergency Department
Admission: EM | Admit: 2016-12-21 | Discharge: 2016-12-21 | Disposition: A | Discharge status: Home / Self Care | Payer: Medicare Other | Attending: Emergency Medicine | Admitting: Emergency Medicine

## 2016-12-21 DIAGNOSIS — Y999 Unspecified external cause status: Secondary | ICD-10-CM | POA: Insufficient documentation

## 2016-12-21 DIAGNOSIS — Y929 Unspecified place or not applicable: Secondary | ICD-10-CM | POA: Diagnosis not present

## 2016-12-21 DIAGNOSIS — Z7982 Long term (current) use of aspirin: Secondary | ICD-10-CM | POA: Diagnosis not present

## 2016-12-21 DIAGNOSIS — S0083XA Contusion of other part of head, initial encounter: Secondary | ICD-10-CM | POA: Diagnosis not present

## 2016-12-21 DIAGNOSIS — S0990XA Unspecified injury of head, initial encounter: Secondary | ICD-10-CM | POA: Diagnosis present

## 2016-12-21 DIAGNOSIS — W01198A Fall on same level from slipping, tripping and stumbling with subsequent striking against other object, initial encounter: Secondary | ICD-10-CM | POA: Insufficient documentation

## 2016-12-21 DIAGNOSIS — S01511A Laceration without foreign body of lip, initial encounter: Secondary | ICD-10-CM | POA: Insufficient documentation

## 2016-12-21 DIAGNOSIS — D649 Anemia, unspecified: Secondary | ICD-10-CM | POA: Insufficient documentation

## 2016-12-21 DIAGNOSIS — Z79899 Other long term (current) drug therapy: Secondary | ICD-10-CM | POA: Diagnosis not present

## 2016-12-21 DIAGNOSIS — Y9389 Activity, other specified: Secondary | ICD-10-CM | POA: Diagnosis not present

## 2016-12-21 DIAGNOSIS — I10 Essential (primary) hypertension: Secondary | ICD-10-CM | POA: Insufficient documentation

## 2016-12-21 MED ORDER — ACETAMINOPHEN 325 MG PO TABS
650.0000 mg | ORAL_TABLET | Freq: Once | ORAL | Status: AC
  Administered 2016-12-21: 650 mg via ORAL
  Filled 2016-12-21: qty 2

## 2016-12-21 MED ORDER — SODIUM CHLORIDE 0.9 % IV BOLUS (SEPSIS): 1000.0000 mL | Freq: Once | INTRAVENOUS | Status: DC

## 2016-12-21 MED ORDER — HYDROMORPHONE HCL 1 MG/ML IJ SOLN: 1.0000 mg | Freq: Once | INTRAMUSCULAR | Status: DC

## 2016-12-21 MED ORDER — ONDANSETRON HCL 4 MG/2ML IJ SOLN: 4.0000 mg | Freq: Once | INTRAMUSCULAR | Status: DC

## 2016-12-21 NOTE — ED Provider Notes (Signed)
[facility] Emergency Department Provider Note ____________________________________________   I have reviewed the triage vital signs and the triage nursing note.  HISTORY  Chief Complaint Fall   Historian Patient  HPI Vicki Raymond is a 83 y.o. female presents from home where she tripped over the corner of a rug.  She states that she fell forward and struck the left side of her face.  She is complaining of mild headache, left facial pain, bleeding to the internal left upper lip, and mild neck pain.  No trouble breathing or chest pain.  No shortness of breath.  No recent illnesses.  No extremity pains other than some chronic knee pain and she has planned surgery on that next week.   Past Medical History:  Diagnosis Date  . Anemia   . Anginal pain ([place])   . Anxiety   . Cancer [facility])    uterine, endometrial  . Diverticulosis   . GERD (gastroesophageal reflux disease)   . Heart murmur   . Hypertension   . Peripheral neuropathy   . Pneumonia   . Shortness of breath dyspnea     Patient Active Problem List   Diagnosis Date Noted  . Sinus bradycardia 07/05/2014    Past Surgical History:  Procedure Laterality Date  . ABDOMINAL HYSTERECTOMY    . BREAST BIOPSY Left 1980's  . BREAST SURGERY Left   . CHOLECYSTECTOMY    . COLONOSCOPY WITH PROPOFOL N/A 01/14/2015   Procedure: COLONOSCOPY WITH PROPOFOL;  Surgeon: Manya Silvas, MD;  Location: [facility] ENDOSCOPY;  Service: Endoscopy;  Laterality: N/A;  . ESOPHAGOGASTRODUODENOSCOPY (EGD) WITH PROPOFOL N/A 01/14/2015   Procedure: ESOPHAGOGASTRODUODENOSCOPY (EGD) WITH PROPOFOL;  Surgeon: Manya Silvas, MD;  Location: [facility] ENDOSCOPY;  Service: Endoscopy;  Laterality: N/A;  . EYE SURGERY    . INSERT / REPLACE / REMOVE PACEMAKER    . JOINT REPLACEMENT Left    Left hip replacement  . PACEMAKER LEAD REMOVAL N/A 07/05/2014   Procedure: Pacemaker insertion ;  Surgeon: Isaias Cowman, MD;  Location: [facility]  ORS;  Service: Cardiovascular;  Laterality: N/A;  . TONSILLECTOMY      Prior to Admission medications   Medication Sig Start Date End Date Taking? Authorizing Provider  acetaminophen (TYLENOL) 325 MG tablet Take 325 mg by mouth every 8 (eight) hours as needed for moderate pain.    [provider]  allopurinol (ZYLOPRIM) 300 MG tablet Take 300 mg by mouth daily.    [provider]  amLODipine (NORVASC) 5 MG tablet Take 1 tablet (5 mg total) by mouth daily. Patient taking differently: Take 5 mg by mouth daily as needed (for elevated BP).  07/06/14   Paraschos, Sheppard Coil, MD  aspirin EC 81 MG tablet Take 81 mg by mouth daily.    [provider]  CALCIUM-MAGNESIUM-ZINC PO Take 1 tablet by mouth daily.    [provider]  Cholecalciferol (VITAMIN D3) 2000 units TABS Take 2,000 Units by mouth daily.    [provider]  clarithromycin (BIAXIN) 250 MG tablet Take 1 tablet (250 mg total) by mouth every 12 (twelve) hours. Patient not taking: Reported on 12/09/2016 07/06/14   Isaias Cowman, MD  Cyanocobalamin 1000 MCG/ML LIQD Take 1 mL by mouth every other day.     [provider]  docusate sodium (COLACE) 100 MG capsule Take 100 mg by mouth daily.    [provider]  furosemide (LASIX) 20 MG tablet Take 20 mg by mouth daily.    [provider]  gabapentin (NEURONTIN) 100 MG capsule Take 100 mg by mouth 3 (three) times daily.    [provider]  GLUCOSAMINE-CHONDROITIN PO Take 1 tablet by mouth daily.    [provider]  HYDROcodone-acetaminophen (NORCO/VICODIN) 5-325 MG tablet Take 0.5 tablets by mouth 2 (two) times daily as needed for moderate pain.    [provider]  losartan (COZAAR) 25 MG tablet Take 25 mg by mouth daily as needed (for elevated BP).     [provider]  mirabegron ER (MYRBETRIQ) 50 MG TB24 tablet Take 50 mg by mouth daily.    [provider]  Multiple Vitamin  (MULTIVITAMIN) tablet Take 1 tablet by mouth daily.    [provider]  omeprazole (PRILOSEC) 20 MG capsule Take 20 mg by mouth daily.    [provider]  sertraline (ZOLOFT) 50 MG tablet Take 50 mg by mouth daily. 11/09/16   [provider]    Allergies  Allergen Reactions  . Corn-Containing Products Anaphylaxis  . Ace Inhibitors Cough  . Angiotensin Receptor Blockers Cough  . Cefazolin Other (See Comments)  . Penicillins Hives    Has patient had a PCN reaction causing immediate rash, facial/tongue/throat swelling, SOB or lightheadedness with hypotension: Yes Has patient had a PCN reaction causing severe rash involving mucus membranes or skin necrosis: No Has patient had a PCN reaction that required hospitalization: No Has patient had a PCN reaction occurring within the last 10 years: No If all of the above answers are "NO", then may proceed with Cephalosporin use.   Marland Kitchen Keflex [Cephalexin] Itching and Rash    Family History  Problem Relation Age of Onset  . Hypertension Mother   . Hypertension Father   . Hypertension Sister   . Hypertension Brother   . Hypertension Other   . Diabetes Mellitus II Maternal Grandmother     Social History Social History  Substance Use Topics  . Smoking status: Never Smoker  . Smokeless tobacco: Never Used  . Alcohol use No    Review of Systems  Constitutional: Negative for fever. Eyes: Negative for visual changes. ENT: Negative for sore throat. Cardiovascular: Negative for chest pain. Respiratory: Negative for shortness of breath. Gastrointestinal: Negative for vomiting and diarrhea. Genitourinary: Negative for dysuria. Musculoskeletal: Negative for back pain. Skin: Negative for rash. Neurological: Positive for headache.  ____________________________________________   PHYSICAL EXAM:  VITAL SIGNS: ED Triage Vitals [12/21/16 0958]  Enc Vitals Group     BP      Pulse Rate 92     Resp 16     Temp 98.4  F (36.9 C)     Temp Source Oral     SpO2 100 %     Weight 190 lb (86.2 kg)     Height 5\' 10"  (1.778 m)     Head Circumference      Peak Flow      Pain Score 7     Pain Loc      Pain Edu?      Excl. in [place]?    Constitutional: Alert and oriented. Well appearing and in no distress. HEENT   Head: Normocephalic and atraumatic.      Eyes: Conjunctivae are normal. Pupils equal and round.       Ears:         Nose: No congestion/rhinnorhea.   Mouth/Throat: Mucous membranes are moist.  Laceration left inner upper lip   Neck: No stridor.  C-spine step-off.  Mild midline and paraspinous  tenderness to palpation. Cardiovascular/Chest: Normal rate, regular rhythm.  No murmurs, rubs, or gallops. Respiratory: Normal respiratory effort without tachypnea nor retractions. Breath sounds are clear and equal bilaterally. No wheezes/rales/rhonchi. Gastrointestinal: Soft. No distention, no guarding, no rebound. Nontender.    Genitourinary/rectal:Deferred Musculoskeletal: Stable nontender.  Nontender with normal range of motion in all extremities. No joint effusions.  No lower extremity tenderness.  No edema. Neurologic:  Normal speech and language. No gross or focal neurologic deficits are appreciated. Skin:  Skin is warm, dry and intact. No rash noted. Psychiatric: Mood and affect are normal. Speech and behavior are normal. Patient exhibits appropriate insight and judgment.   ____________________________________________  LABS (pertinent positives/negatives) I, Lisa Roca, MD the attending physician have reviewed the labs noted below.  Labs Reviewed - No data to display  ____________________________________________    EKG I, Lisa Roca, MD, the attending physician have personally viewed and interpreted all ECGs.  None ____________________________________________  RADIOLOGY All Xrays were viewed by me.  Imaging interpreted by Radiologist, and I, Lisa Roca, MD the attending  physician have reviewed the radiologist interpretation noted below.  CT head and maxillo facial CT:   IMPRESSION: No skull fracture or intracranial hemorrhage.  No facial fracture or mandible dislocation.  LEFT facial soft tissue swelling.  CT cervical spine:  IMPRESSION: 1. No fracture or spondylolisthesis.  2. Multilevel osteoarthritic change. No frank disc extrusion or stenosis. Areas of exit foraminal narrowing as noted above.  3. Areas of arterial vascular calcification in both carotid arteries and right subclavian artery.  4. Multinodular goiter without dominant mass. __________________________________________  PROCEDURES  Procedure(s) performed: None  Critical Care performed: None  ____________________________________________  No current facility-administered medications on file prior to encounter.    Current Outpatient Prescriptions on File Prior to Encounter  Medication Sig Dispense Refill  . acetaminophen (TYLENOL) 325 MG tablet Take 325 mg by mouth every 8 (eight) hours as needed for moderate pain.    Marland Kitchen allopurinol (ZYLOPRIM) 300 MG tablet Take 300 mg by mouth daily.    Marland Kitchen amLODipine (NORVASC) 5 MG tablet Take 1 tablet (5 mg total) by mouth daily. (Patient taking differently: Take 5 mg by mouth daily as needed (for elevated BP). ) 30 tablet 5  . aspirin EC 81 MG tablet Take 81 mg by mouth daily.    Marland Kitchen CALCIUM-MAGNESIUM-ZINC PO Take 1 tablet by mouth daily.    . Cholecalciferol (VITAMIN D3) 2000 units TABS Take 2,000 Units by mouth daily.    . clarithromycin (BIAXIN) 250 MG tablet Take 1 tablet (250 mg total) by mouth every 12 (twelve) hours. (Patient not taking: Reported on 12/09/2016) 14 tablet 0  . Cyanocobalamin 1000 MCG/ML LIQD Take 1 mL by mouth every other day.     . docusate sodium (COLACE) 100 MG capsule Take 100 mg by mouth daily.    . furosemide (LASIX) 20 MG tablet Take 20 mg by mouth daily.    Marland Kitchen gabapentin (NEURONTIN) 100 MG capsule Take 100 mg by  mouth 3 (three) times daily.    Marland Kitchen GLUCOSAMINE-CHONDROITIN PO Take 1 tablet by mouth daily.    Marland Kitchen HYDROcodone-acetaminophen (NORCO/VICODIN) 5-325 MG tablet Take 0.5 tablets by mouth 2 (two) times daily as needed for moderate pain.    Marland Kitchen losartan (COZAAR) 25 MG tablet Take 25 mg by mouth daily as needed (for elevated BP).     . mirabegron ER (MYRBETRIQ) 50 MG TB24 tablet Take 50 mg by mouth daily.    . Multiple Vitamin (MULTIVITAMIN) tablet  Take 1 tablet by mouth daily.    Marland Kitchen omeprazole (PRILOSEC) 20 MG capsule Take 20 mg by mouth daily.    . sertraline (ZOLOFT) 50 MG tablet Take 50 mg by mouth daily.  12    ____________________________________________  ED COURSE / ASSESSMENT AND PLAN  Pertinent labs & imaging results that were available during my care of the patient were reviewed by me and considered in my medical decision making (see chart for details).   Patient gives clear count that she had a mechanical fall in the recent other illnesses.  We discussed whether or not to obtain any screening laboratory studies urinalysis, and she did not want to proceed.  I think this is reasonable given clear history of mechanical fall.  From a traumatic standpoint, no extremity or trunk injury.  She clearly took a fall to the face, complaint of headache and on reexamination was complaining of some mild posterior neck pain, so she was sent back for CT of the neck as well.  CT head and maxillofacial were negative for significant trauma, contusion to the face.  She has a laceration to the inner lip which we discussed does not need emergency treatment, will heal up on its own.  She is up-to-date with a tetanus.   CONSULTATIONS:   None   Patient / Family / Caregiver informed of clinical course, medical decision-making process, and agree with plan.   I discussed return precautions, follow-up instructions, and discharge instructions with patient and/or family.  Discharge Instructions : You are evaluated for  fall and found to have bruising to the face, laceration to the inside of the lip which will heal on its own, and no significant serious injury.  Return to the emergency department immediately for any new or worsening pain, uncontrolled bleeding, confusion or altered mental status, fever, dizziness, passing out, or any other symptoms concerning to you.  ___________________________________________   FINAL CLINICAL IMPRESSION(S) / ED DIAGNOSES   Final diagnoses:  Contusion of face, initial encounter  Lip laceration, initial encounter  Injury of head, initial encounter              Note: This dictation was prepared with Dragon dictation. Any transcriptional errors that result from this process are unintentional    Lisa Roca, MD 12/21/16 1206

## 2016-12-21 NOTE — ED Triage Notes (Signed)
Pt to ED via EMS from home with c/o mechanical fall, pt tripped on cord in basement and LFT side of face. Pt has swelling noted to LFT side, denies any LOC. Pt takes daily Asprin. A&Ox4

## 2016-12-21 NOTE — Discharge Instructions (Signed)
You are evaluated for fall and found to have bruising to the face, laceration to the inside of the lip which will heal on its own, and no significant serious injury.  Return to the emergency department immediately for any new or worsening pain, uncontrolled bleeding, confusion or altered mental status, fever, dizziness, passing out, or any other symptoms concerning to you.

## 2016-12-21 NOTE — ED Notes (Signed)
Pt noted to be set for discharge is disposition but pt has just been taken to CT

## 2016-12-21 NOTE — ED Notes (Signed)
Patient transported to CT 

## 2016-12-21 NOTE — ED Notes (Signed)
Pt assisted to ambulate to bathroom to void

## 2016-12-21 NOTE — ED Notes (Signed)
Family at bedside. 

## 2016-12-22 ENCOUNTER — Encounter
Admission: RE | Admit: 2016-12-22 | Discharge: 2016-12-22 | Disposition: A | Discharge status: Home / Self Care | Payer: Medicare Other | Source: Ambulatory Visit | Attending: Orthopedic Surgery | Admitting: Orthopedic Surgery

## 2016-12-22 DIAGNOSIS — Z01812 Encounter for preprocedural laboratory examination: Secondary | ICD-10-CM | POA: Diagnosis not present

## 2016-12-22 HISTORY — DX: Nausea with vomiting, unspecified: Z98.890

## 2016-12-22 HISTORY — DX: Presence of cardiac pacemaker: Z95.0

## 2016-12-22 HISTORY — DX: Adverse effect of unspecified anesthetic, initial encounter: T41.45XA

## 2016-12-22 HISTORY — DX: Other complications of anesthesia, initial encounter: T88.59XA

## 2016-12-22 HISTORY — DX: Nausea with vomiting, unspecified: R11.2

## 2016-12-22 LAB — TYPE AND SCREEN
ABO/RH(D): A POS
Antibody Screen: NEGATIVE

## 2016-12-22 LAB — URINALYSIS, ROUTINE W REFLEX MICROSCOPIC
Bilirubin Urine: NEGATIVE
GLUCOSE, UA: NEGATIVE mg/dL
HGB URINE DIPSTICK: NEGATIVE
KETONES UR: NEGATIVE mg/dL
LEUKOCYTES UA: NEGATIVE
Nitrite: NEGATIVE
PH: 5 (ref 5.0–8.0)
Protein, ur: NEGATIVE mg/dL
Specific Gravity, Urine: 1.018 (ref 1.005–1.030)

## 2016-12-22 LAB — CBC
HCT: 42.5 % (ref 35.0–47.0)
Hemoglobin: 14.1 g/dL (ref 12.0–16.0)
MCH: 33 pg (ref 26.0–34.0)
MCHC: 33.1 g/dL (ref 32.0–36.0)
MCV: 99.7 fL (ref 80.0–100.0)
PLATELETS: 196 10*3/uL (ref 150–440)
RBC: 4.26 MIL/uL (ref 3.80–5.20)
RDW: 15.2 % — AB (ref 11.5–14.5)
WBC: 9.4 10*3/uL (ref 3.6–11.0)

## 2016-12-22 LAB — BASIC METABOLIC PANEL
Anion gap: 4 — ABNORMAL LOW (ref 5–15)
BUN: 17 mg/dL (ref 6–20)
CALCIUM: 9.6 mg/dL (ref 8.9–10.3)
CO2: 26 mmol/L (ref 22–32)
CREATININE: 0.81 mg/dL (ref 0.44–1.00)
Chloride: 107 mmol/L (ref 101–111)
Glucose, Bld: 104 mg/dL — ABNORMAL HIGH (ref 65–99)
Potassium: 3.7 mmol/L (ref 3.5–5.1)
SODIUM: 137 mmol/L (ref 135–145)

## 2016-12-22 LAB — SEDIMENTATION RATE: SED RATE: 21 mm/h (ref 0–30)

## 2016-12-22 LAB — APTT: aPTT: 31 seconds (ref 24–36)

## 2016-12-22 LAB — PROTIME-INR
INR: 1.07
PROTHROMBIN TIME: 13.8 s (ref 11.4–15.2)

## 2016-12-22 NOTE — Patient Instructions (Signed)
Your procedure is scheduled on: Tuesday, December 29, 2016 Report to Same Day Surgery on the 2nd floor in the [facility]. To find out your arrival time, please call [phone] between 1PM - 3PM on: Monday, December 28, 2016  REMEMBER: Instructions that are not followed completely may result in serious medical risk up to and including death; or upon the discretion of your surgeon and anesthesiologist your surgery may need to be rescheduled.  Do not eat food or drink liquids after midnight. No gum chewing or hard candies.  You may however, drink CLEAR liquids up to 2 hours before you are scheduled to arrive at the hospital for your procedure.  Do not drink clear liquids within 2 hours of your scheduled arrival to the hospital as this may lead to your procedure being delayed or rescheduled.  Clear liquids include: - water  - apple juice without pulp - clear gatorade - black coffee or tea (NO milk, creamers, sugars) DO NOT drink anything not on this list.  No Alcohol for 24 hours before or after surgery.  No Smoking for 24 hours prior to surgery.  Notify your doctor if there is any change in your medical condition (cold, fever, infection).  Do not wear jewelry, make-up, hairpins, clips or nail polish.  Do not wear lotions, powders, or perfumes.   Do not shave 48 hours prior to surgery.   Contacts and dentures may not be worn into surgery.  Do not bring valuables to the hospital. [facility] is not responsible for any belongings or valuables.   TAKE THESE MEDICATIONS THE MORNING OF SURGERY WITH A SIP OF WATER:  1.  GABAPENTIN 2.  HYDROCODONE (IF NEEDED FOR PAIN) 3.  OMEPRAZOLE (take one capsule the night before surgery and one capsule the morning of surgery) - helps to prevent nausea after surgery. 4.  ZOLOFT 5.  AMLODIPINE (IF NEEDED FOR ELEVATED B/P)  Use CHG Soap as directed on instruction sheet.  Follow recommendations from PCP regarding stopping Aspirin. STOP  TODAY.  NOW!  Stop Anti-inflammatories such as Advil, Aleve, Ibuprofen, Motrin, Naproxen, Naprosyn, Goodie powder, or aspirin products. (May take Tylenol or Acetaminophen if needed.)  NOW!  Stop OVER THE COUNTER supplements until after surgery. (May continue Vitamin D, Vitamin B, and multivitamin.)  If you are being admitted to the hospital overnight, leave your suitcase in the car. After surgery it may be brought to your room.  Please call the number above if you have any questions about these instructions.

## 2016-12-23 LAB — SURGICAL PCR SCREEN
MRSA, PCR: POSITIVE — AB
Staphylococcus aureus: POSITIVE — AB

## 2016-12-23 LAB — URINE CULTURE

## 2016-12-23 NOTE — Pre-Procedure Instructions (Signed)
CLEARED BY DR Ubaldo Glassing 12/17/16. LABS WITH POSITIVE PCR SCREEN FAXED TO DR [facility]

## 2016-12-24 NOTE — Progress Notes (Signed)
Dr. Rudene Christians entered an addendum note to his history and physical for this patient stating that all examination and discussion during the visit was for the LEFT knee. According to [facility] (Dr. Theodore Demark office); the patient's allergy to corn containing products and the clindamycin IV ordered by Dr. Rudene Christians was reviewed by Dr. Rudene Christians. Dr. Rudene Christians still wants patient to have Clindamycin as ordered for surgical prophylaxis.

## 2016-12-25 NOTE — Pre-Procedure Instructions (Signed)
Faxed request for new H&P to Dr. Rudene Christians.  H&P will be outdated by day of surgery.  Day of surgery is now 01/05/17.

## 2017-01-05 ENCOUNTER — Other Ambulatory Visit: Payer: Self-pay

## 2017-01-05 ENCOUNTER — Inpatient Hospital Stay: Payer: Medicare Other | Admitting: Anesthesiology

## 2017-01-05 ENCOUNTER — Inpatient Hospital Stay: Payer: Medicare Other

## 2017-01-05 ENCOUNTER — Encounter
Admission: RE | Disposition: A | Discharge status: Skilled Nursing Facility | Payer: Self-pay | Source: Ambulatory Visit | Attending: Orthopedic Surgery

## 2017-01-05 ENCOUNTER — Inpatient Hospital Stay
Admission: RE | Admit: 2017-01-05 | Discharge: 2017-01-08 | DRG: 470 | Disposition: A | Discharge status: Skilled Nursing Facility | Payer: Medicare Other | Source: Ambulatory Visit | Attending: Orthopedic Surgery | Admitting: Orthopedic Surgery

## 2017-01-05 DIAGNOSIS — M21062 Valgus deformity, not elsewhere classified, left knee: Secondary | ICD-10-CM | POA: Diagnosis present

## 2017-01-05 DIAGNOSIS — Z8542 Personal history of malignant neoplasm of other parts of uterus: Secondary | ICD-10-CM | POA: Diagnosis not present

## 2017-01-05 DIAGNOSIS — M25562 Pain in left knee: Secondary | ICD-10-CM | POA: Diagnosis present

## 2017-01-05 DIAGNOSIS — M1A9XX0 Chronic gout, unspecified, without tophus (tophi): Secondary | ICD-10-CM | POA: Diagnosis present

## 2017-01-05 DIAGNOSIS — K219 Gastro-esophageal reflux disease without esophagitis: Secondary | ICD-10-CM | POA: Diagnosis present

## 2017-01-05 DIAGNOSIS — Y92230 Patient room in hospital as the place of occurrence of the external cause: Secondary | ICD-10-CM | POA: Diagnosis not present

## 2017-01-05 DIAGNOSIS — I10 Essential (primary) hypertension: Secondary | ICD-10-CM | POA: Diagnosis present

## 2017-01-05 DIAGNOSIS — Z881 Allergy status to other antibiotic agents status: Secondary | ICD-10-CM | POA: Diagnosis not present

## 2017-01-05 DIAGNOSIS — Z9842 Cataract extraction status, left eye: Secondary | ICD-10-CM | POA: Diagnosis not present

## 2017-01-05 DIAGNOSIS — G629 Polyneuropathy, unspecified: Secondary | ICD-10-CM | POA: Diagnosis present

## 2017-01-05 DIAGNOSIS — Z950 Presence of cardiac pacemaker: Secondary | ICD-10-CM | POA: Diagnosis not present

## 2017-01-05 DIAGNOSIS — Z803 Family history of malignant neoplasm of breast: Secondary | ICD-10-CM

## 2017-01-05 DIAGNOSIS — E785 Hyperlipidemia, unspecified: Secondary | ICD-10-CM | POA: Diagnosis present

## 2017-01-05 DIAGNOSIS — M659 Synovitis and tenosynovitis, unspecified: Secondary | ICD-10-CM | POA: Diagnosis present

## 2017-01-05 DIAGNOSIS — Z888 Allergy status to other drugs, medicaments and biological substances status: Secondary | ICD-10-CM

## 2017-01-05 DIAGNOSIS — Z96642 Presence of left artificial hip joint: Secondary | ICD-10-CM | POA: Diagnosis present

## 2017-01-05 DIAGNOSIS — Z8249 Family history of ischemic heart disease and other diseases of the circulatory system: Secondary | ICD-10-CM

## 2017-01-05 DIAGNOSIS — R011 Cardiac murmur, unspecified: Secondary | ICD-10-CM | POA: Diagnosis present

## 2017-01-05 DIAGNOSIS — M1712 Unilateral primary osteoarthritis, left knee: Principal | ICD-10-CM | POA: Diagnosis present

## 2017-01-05 DIAGNOSIS — Z9049 Acquired absence of other specified parts of digestive tract: Secondary | ICD-10-CM | POA: Diagnosis not present

## 2017-01-05 DIAGNOSIS — Z9841 Cataract extraction status, right eye: Secondary | ICD-10-CM

## 2017-01-05 DIAGNOSIS — Z79899 Other long term (current) drug therapy: Secondary | ICD-10-CM

## 2017-01-05 DIAGNOSIS — R443 Hallucinations, unspecified: Secondary | ICD-10-CM | POA: Diagnosis not present

## 2017-01-05 DIAGNOSIS — Z23 Encounter for immunization: Secondary | ICD-10-CM

## 2017-01-05 DIAGNOSIS — Z961 Presence of intraocular lens: Secondary | ICD-10-CM | POA: Diagnosis present

## 2017-01-05 DIAGNOSIS — Z7982 Long term (current) use of aspirin: Secondary | ICD-10-CM

## 2017-01-05 DIAGNOSIS — Z880 Allergy status to penicillin: Secondary | ICD-10-CM | POA: Diagnosis not present

## 2017-01-05 DIAGNOSIS — Z833 Family history of diabetes mellitus: Secondary | ICD-10-CM

## 2017-01-05 DIAGNOSIS — T404X5A Adverse effect of other synthetic narcotics, initial encounter: Secondary | ICD-10-CM | POA: Diagnosis not present

## 2017-01-05 DIAGNOSIS — Z90710 Acquired absence of both cervix and uterus: Secondary | ICD-10-CM

## 2017-01-05 DIAGNOSIS — Z8262 Family history of osteoporosis: Secondary | ICD-10-CM

## 2017-01-05 DIAGNOSIS — Z823 Family history of stroke: Secondary | ICD-10-CM

## 2017-01-05 DIAGNOSIS — G8918 Other acute postprocedural pain: Secondary | ICD-10-CM

## 2017-01-05 DIAGNOSIS — Z800 Family history of malignant neoplasm of digestive organs: Secondary | ICD-10-CM

## 2017-01-05 HISTORY — PX: TOTAL KNEE ARTHROPLASTY: SHX125

## 2017-01-05 LAB — CREATININE, SERUM
Creatinine, Ser: 0.6 mg/dL (ref 0.44–1.00)
GFR calc Af Amer: >60 mL/min (ref >60)
GFR calc non Af Amer: >60 mL/min (ref >60)

## 2017-01-05 LAB — CBC
HCT: 39.6 % (ref 35.0–47.0)
Hemoglobin: 12.8 g/dL (ref 12.0–16.0)
MCH: 32.8 pg (ref 26.0–34.0)
MCHC: 32.3 g/dL (ref 32.0–36.0)
MCV: 101.7 fL — AB (ref 80.0–100.0)
PLATELETS: 184 10*3/uL (ref 150–440)
RBC: 3.89 MIL/uL (ref 3.80–5.20)
RDW: 14.9 % — ABNORMAL HIGH (ref 11.5–14.5)
WBC: 9.8 10*3/uL (ref 3.6–11.0)

## 2017-01-05 LAB — ABO/RH: ABO/RH(D): A POS

## 2017-01-05 SURGERY — ARTHROPLASTY, KNEE, TOTAL
Anesthesia: Spinal | Laterality: Left | Wound class: Clean

## 2017-01-05 MED ORDER — METOCLOPRAMIDE HCL 5 MG/ML IJ SOLN: 5.0000 mg | Freq: Three times a day (TID) | INTRAMUSCULAR | Status: DC | PRN

## 2017-01-05 MED ORDER — INFLUENZA VAC SPLIT HIGH-DOSE 0.5 ML IM SUSY
0.5000 mL | PREFILLED_SYRINGE | INTRAMUSCULAR | Status: AC
  Administered 2017-01-07: 0.5 mL via INTRAMUSCULAR
  Filled 2017-01-05: qty 0.5

## 2017-01-05 MED ORDER — AMLODIPINE BESYLATE 5 MG PO TABS
5.0000 mg | ORAL_TABLET | Freq: Every day | ORAL | Status: DC | PRN
  Administered 2017-01-06: 5 mg via ORAL
  Filled 2017-01-05: qty 1

## 2017-01-05 MED ORDER — METHOCARBAMOL 1000 MG/10ML IJ SOLN
500.0000 mg | Freq: Four times a day (QID) | INTRAVENOUS | Status: DC | PRN
  Filled 2017-01-05: qty 5

## 2017-01-05 MED ORDER — PHENOL 1.4 % MT LIQD
1.0000 | OROMUCOSAL | Status: DC | PRN
  Filled 2017-01-05: qty 177

## 2017-01-05 MED ORDER — VITAMIN D 1000 UNITS PO TABS
2000.0000 [IU] | ORAL_TABLET | Freq: Every day | ORAL | Status: DC
  Administered 2017-01-06 – 2017-01-08 (×3): 2000 [IU] via ORAL
  Filled 2017-01-05 (×3): qty 2

## 2017-01-05 MED ORDER — OXYCODONE HCL 5 MG PO TABS
5.0000 mg | ORAL_TABLET | ORAL | Status: DC | PRN
  Administered 2017-01-05 – 2017-01-08 (×4): 5 mg via ORAL
  Filled 2017-01-05 (×4): qty 1

## 2017-01-05 MED ORDER — CLINDAMYCIN PHOSPHATE 900 MG/50ML IV SOLN: 900.0000 mg | Freq: Once | INTRAVENOUS | Status: DC

## 2017-01-05 MED ORDER — FENTANYL CITRATE (PF) 100 MCG/2ML IJ SOLN
INTRAMUSCULAR | Status: DC | PRN
  Administered 2017-01-05 (×2): 50 ug via INTRAVENOUS

## 2017-01-05 MED ORDER — ACETAMINOPHEN 325 MG PO TABS
650.0000 mg | ORAL_TABLET | ORAL | Status: DC | PRN
  Administered 2017-01-08: 650 mg via ORAL
  Filled 2017-01-05: qty 2

## 2017-01-05 MED ORDER — MORPHINE SULFATE (PF) 2 MG/ML IV SOLN
2.0000 mg | INTRAVENOUS | Status: DC | PRN
  Administered 2017-01-05 – 2017-01-06 (×4): 2 mg via INTRAVENOUS
  Filled 2017-01-05 (×4): qty 1

## 2017-01-05 MED ORDER — MENTHOL 3 MG MT LOZG
1.0000 | LOZENGE | OROMUCOSAL | Status: DC | PRN
  Administered 2017-01-08: 3 mg via ORAL
  Filled 2017-01-05 (×2): qty 9

## 2017-01-05 MED ORDER — MAGNESIUM CITRATE PO SOLN
1.0000 | Freq: Once | ORAL | Status: DC | PRN
  Filled 2017-01-05: qty 296

## 2017-01-05 MED ORDER — ALLOPURINOL 300 MG PO TABS
300.0000 mg | ORAL_TABLET | Freq: Every day | ORAL | Status: DC
  Administered 2017-01-06 – 2017-01-08 (×3): 300 mg via ORAL
  Filled 2017-01-05 (×3): qty 1

## 2017-01-05 MED ORDER — SERTRALINE HCL 50 MG PO TABS
50.0000 mg | ORAL_TABLET | Freq: Every day | ORAL | Status: DC
  Administered 2017-01-06 – 2017-01-08 (×3): 50 mg via ORAL
  Filled 2017-01-05 (×3): qty 1

## 2017-01-05 MED ORDER — ONDANSETRON HCL 4 MG PO TABS
4.0000 mg | ORAL_TABLET | Freq: Four times a day (QID) | ORAL | Status: DC | PRN
  Administered 2017-01-06: 4 mg via ORAL
  Filled 2017-01-05 (×3): qty 1

## 2017-01-05 MED ORDER — ACETAMINOPHEN 650 MG RE SUPP
650.0000 mg | RECTAL | Status: DC | PRN
Start: 2017-01-05 — End: 2017-01-08

## 2017-01-05 MED ORDER — VITAMIN B-12 1000 MCG PO TABS
1000.0000 ug | ORAL_TABLET | ORAL | Status: DC
  Administered 2017-01-06 – 2017-01-08 (×2): 1000 ug via ORAL
  Filled 2017-01-05 (×2): qty 1

## 2017-01-05 MED ORDER — BUPIVACAINE-EPINEPHRINE (PF) 0.25% -1:200000 IJ SOLN
INTRAMUSCULAR | Status: AC
  Filled 2017-01-05: qty 30

## 2017-01-05 MED ORDER — TRANEXAMIC ACID 1000 MG/10ML IV SOLN
INTRAVENOUS | Status: DC | PRN
  Administered 2017-01-05: 1000 mg via INTRAVENOUS

## 2017-01-05 MED ORDER — VANCOMYCIN HCL 1000 MG IV SOLR
INTRAVENOUS | Status: AC
  Filled 2017-01-05: qty 1000

## 2017-01-05 MED ORDER — PROPOFOL 10 MG/ML IV BOLUS: INTRAVENOUS | Status: DC | PRN

## 2017-01-05 MED ORDER — BUPIVACAINE-EPINEPHRINE (PF) 0.25% -1:200000 IJ SOLN
INTRAMUSCULAR | Status: DC | PRN
  Administered 2017-01-05: 30 mL via PERINEURAL

## 2017-01-05 MED ORDER — LACTATED RINGERS IV SOLN: INTRAVENOUS | Status: DC

## 2017-01-05 MED ORDER — FENTANYL CITRATE (PF) 100 MCG/2ML IJ SOLN
INTRAMUSCULAR | Status: AC
  Filled 2017-01-05: qty 2

## 2017-01-05 MED ORDER — PROPOFOL 500 MG/50ML IV EMUL
INTRAVENOUS | Status: DC | PRN
  Administered 2017-01-05: 50 ug/kg/min via INTRAVENOUS

## 2017-01-05 MED ORDER — SODIUM CHLORIDE 0.9 % IV SOLN
INTRAVENOUS | Status: DC | PRN
  Administered 2017-01-05: 13:00:00 via INTRAVENOUS

## 2017-01-05 MED ORDER — ONDANSETRON HCL 4 MG/2ML IJ SOLN
4.0000 mg | Freq: Four times a day (QID) | INTRAMUSCULAR | Status: DC | PRN
  Administered 2017-01-05: 4 mg via INTRAVENOUS
  Filled 2017-01-05: qty 2

## 2017-01-05 MED ORDER — SODIUM CHLORIDE 0.9 % IV SOLN
INTRAVENOUS | Status: DC | PRN
  Administered 2017-01-05: 60 mL

## 2017-01-05 MED ORDER — ADULT MULTIVITAMIN W/MINERALS CH
1.0000 | ORAL_TABLET | Freq: Every day | ORAL | Status: DC
  Administered 2017-01-06 – 2017-01-08 (×3): 1 via ORAL
  Filled 2017-01-05 (×3): qty 1

## 2017-01-05 MED ORDER — METOCLOPRAMIDE HCL 10 MG PO TABS
5.0000 mg | ORAL_TABLET | Freq: Three times a day (TID) | ORAL | Status: DC | PRN
  Administered 2017-01-06: 10 mg via ORAL
  Filled 2017-01-05: qty 1

## 2017-01-05 MED ORDER — SODIUM CHLORIDE 0.9 % IV SOLN
INTRAVENOUS | Status: DC
  Administered 2017-01-05 (×2): via INTRAVENOUS

## 2017-01-05 MED ORDER — MAGNESIUM HYDROXIDE 400 MG/5ML PO SUSP
30.0000 mL | Freq: Every day | ORAL | Status: DC | PRN
  Filled 2017-01-05: qty 30

## 2017-01-05 MED ORDER — BUPIVACAINE LIPOSOME 1.3 % IJ SUSP
INTRAMUSCULAR | Status: AC
  Filled 2017-01-05: qty 20

## 2017-01-05 MED ORDER — PANTOPRAZOLE SODIUM 40 MG PO TBEC
80.0000 mg | DELAYED_RELEASE_TABLET | Freq: Every day | ORAL | Status: DC
  Administered 2017-01-06 – 2017-01-08 (×3): 80 mg via ORAL
  Filled 2017-01-05 (×3): qty 2

## 2017-01-05 MED ORDER — SODIUM CHLORIDE 0.9 % IV SOLN
INTRAVENOUS | Status: DC | PRN
  Administered 2017-01-05: 500 mg via INTRAVENOUS

## 2017-01-05 MED ORDER — BUPIVACAINE HCL (PF) 0.5 % IJ SOLN
INTRAMUSCULAR | Status: DC | PRN
  Administered 2017-01-05: 3 mL

## 2017-01-05 MED ORDER — MORPHINE SULFATE 10 MG/ML IJ SOLN
INTRAMUSCULAR | Status: DC | PRN
  Administered 2017-01-05: 10 mg via INTRAVENOUS

## 2017-01-05 MED ORDER — ENOXAPARIN SODIUM 30 MG/0.3ML ~~LOC~~ SOLN
30.0000 mg | Freq: Two times a day (BID) | SUBCUTANEOUS | Status: DC
  Administered 2017-01-06 – 2017-01-08 (×5): 30 mg via SUBCUTANEOUS
  Filled 2017-01-05 (×5): qty 0.3

## 2017-01-05 MED ORDER — CLINDAMYCIN PHOSPHATE 900 MG/50ML IV SOLN
INTRAVENOUS | Status: DC | PRN
  Administered 2017-01-05: 900 mg via INTRAVENOUS

## 2017-01-05 MED ORDER — DOCUSATE SODIUM 100 MG PO CAPS
100.0000 mg | ORAL_CAPSULE | Freq: Two times a day (BID) | ORAL | Status: DC
  Administered 2017-01-05 – 2017-01-08 (×5): 100 mg via ORAL
  Filled 2017-01-05 (×4): qty 1

## 2017-01-05 MED ORDER — BISACODYL 10 MG RE SUPP: 10.0000 mg | Freq: Every day | RECTAL | Status: DC | PRN

## 2017-01-05 MED ORDER — METHOCARBAMOL 500 MG PO TABS
500.0000 mg | ORAL_TABLET | Freq: Four times a day (QID) | ORAL | Status: DC | PRN
  Administered 2017-01-05 – 2017-01-06 (×2): 500 mg via ORAL
  Filled 2017-01-05 (×2): qty 1

## 2017-01-05 MED ORDER — NEOMYCIN-POLYMYXIN B GU 40-200000 IR SOLN
Status: DC | PRN
  Administered 2017-01-05: 12 mL

## 2017-01-05 MED ORDER — SODIUM CHLORIDE 0.9 % IV SOLN
Freq: Once | INTRAVENOUS | Status: DC
  Filled 2017-01-05: qty 6

## 2017-01-05 MED ORDER — LOSARTAN POTASSIUM 25 MG PO TABS
25.0000 mg | ORAL_TABLET | Freq: Every day | ORAL | Status: DC | PRN
  Administered 2017-01-06: 25 mg via ORAL
  Filled 2017-01-05: qty 1

## 2017-01-05 MED ORDER — DIPHENHYDRAMINE HCL 12.5 MG/5ML PO ELIX: 12.5000 mg | ORAL_SOLUTION | ORAL | Status: DC | PRN

## 2017-01-05 MED ORDER — ONDANSETRON HCL 4 MG/2ML IJ SOLN: 4.0000 mg | Freq: Once | INTRAMUSCULAR | Status: DC | PRN

## 2017-01-05 MED ORDER — MIRABEGRON ER 50 MG PO TB24
50.0000 mg | ORAL_TABLET | Freq: Every day | ORAL | Status: DC
  Administered 2017-01-06 – 2017-01-08 (×3): 50 mg via ORAL
  Filled 2017-01-05 (×4): qty 1

## 2017-01-05 MED ORDER — GABAPENTIN 100 MG PO CAPS
100.0000 mg | ORAL_CAPSULE | Freq: Three times a day (TID) | ORAL | Status: DC
  Administered 2017-01-05 – 2017-01-08 (×8): 100 mg via ORAL
  Filled 2017-01-05 (×8): qty 1

## 2017-01-05 MED ORDER — FUROSEMIDE 20 MG PO TABS
20.0000 mg | ORAL_TABLET | Freq: Every day | ORAL | Status: DC
  Administered 2017-01-06 – 2017-01-08 (×3): 20 mg via ORAL
  Filled 2017-01-05 (×3): qty 1

## 2017-01-05 MED ORDER — ZOLPIDEM TARTRATE 5 MG PO TABS
5.0000 mg | ORAL_TABLET | Freq: Every evening | ORAL | Status: DC | PRN
Start: 2017-01-05 — End: 2017-01-08

## 2017-01-05 MED ORDER — FENTANYL CITRATE (PF) 100 MCG/2ML IJ SOLN: 25.0000 ug | INTRAMUSCULAR | Status: DC | PRN

## 2017-01-05 MED ORDER — SODIUM CHLORIDE 0.9 % IV SOLN
INTRAVENOUS | Status: DC | PRN
  Administered 2017-01-05: 30 ug/min via INTRAVENOUS

## 2017-01-05 MED ORDER — ASPIRIN EC 81 MG PO TBEC
81.0000 mg | DELAYED_RELEASE_TABLET | Freq: Every day | ORAL | Status: DC
  Administered 2017-01-06 – 2017-01-08 (×3): 81 mg via ORAL
  Filled 2017-01-05 (×3): qty 1

## 2017-01-05 MED ORDER — CALCIUM-MAGNESIUM-ZINC 333-133-8.3 MG PO TABS: 1.0000 | ORAL_TABLET | Freq: Every day | ORAL | Status: DC

## 2017-01-05 MED ORDER — MORPHINE SULFATE (PF) 10 MG/ML IV SOLN
INTRAVENOUS | Status: AC
  Filled 2017-01-05: qty 1

## 2017-01-05 MED ORDER — OXYCODONE HCL 5 MG PO TABS
10.0000 mg | ORAL_TABLET | ORAL | Status: DC | PRN
  Administered 2017-01-05 – 2017-01-07 (×8): 10 mg via ORAL
  Filled 2017-01-05 (×8): qty 2

## 2017-01-05 MED ORDER — CLINDAMYCIN PHOSPHATE 900 MG/50ML IV SOLN
900.0000 mg | Freq: Four times a day (QID) | INTRAVENOUS | Status: AC
  Administered 2017-01-05 – 2017-01-06 (×3): 900 mg via INTRAVENOUS
  Filled 2017-01-05 (×3): qty 50

## 2017-01-05 SURGICAL SUPPLY — 60 items
BANDAGE ACE 6X5 VEL STRL LF (GAUZE/BANDAGES/DRESSINGS) IMPLANT
BLADE SAW 1 (BLADE) ×3 IMPLANT
BLOCK CUTTING FEMUR 4 LT MED (MISCELLANEOUS) IMPLANT
BLOCK CUTTING TIBIAL 4 (MISCELLANEOUS) IMPLANT
CANISTER SUCT 1200ML W/VALVE (MISCELLANEOUS) ×3 IMPLANT
CANISTER SUCT 3000ML PPV (MISCELLANEOUS) ×6 IMPLANT
CAPT KNEE TOTAL 3 ×3 IMPLANT
CATH FOL LEG HOLDER (MISCELLANEOUS) ×3 IMPLANT
CATH TRAY METER 16FR LF (MISCELLANEOUS) ×3 IMPLANT
CEMENT HV SMART SET (Cement) ×6 IMPLANT
CHLORAPREP W/TINT 26ML (MISCELLANEOUS) ×6 IMPLANT
COOLER POLAR GLACIER W/PUMP (MISCELLANEOUS) ×3 IMPLANT
CUFF TOURN 24 STER (MISCELLANEOUS) IMPLANT
CUFF TOURN 30 STER DUAL PORT (MISCELLANEOUS) IMPLANT
DRAPE SHEET LG 3/4 BI-LAMINATE (DRAPES) ×6 IMPLANT
ELECT CAUTERY BLADE 6.4 (BLADE) ×3 IMPLANT
ELECT REM PT RETURN 9FT ADLT (ELECTROSURGICAL) ×3
ELECTRODE REM PT RTRN 9FT ADLT (ELECTROSURGICAL) ×1 IMPLANT
GAUZE PETRO XEROFOAM 1X8 (MISCELLANEOUS) ×3 IMPLANT
GAUZE SPONGE 4X4 12PLY STRL (GAUZE/BANDAGES/DRESSINGS) IMPLANT
GLOVE BIOGEL PI IND STRL 9 (GLOVE) ×1 IMPLANT
GLOVE BIOGEL PI INDICATOR 9 (GLOVE) ×2
GLOVE INDICATOR 8.0 STRL GRN (GLOVE) ×3 IMPLANT
GLOVE SURG ORTHO 8.0 STRL STRW (GLOVE) ×3 IMPLANT
GLOVE SURG SYN 9.0  PF PI (GLOVE) ×2
GLOVE SURG SYN 9.0 PF PI (GLOVE) ×1 IMPLANT
GOWN SRG 2XL LVL 4 RGLN SLV (GOWNS) ×1 IMPLANT
GOWN STRL NON-REIN 2XL LVL4 (GOWNS) ×2
GOWN STRL REUS W/ TWL LRG LVL3 (GOWN DISPOSABLE) ×1 IMPLANT
GOWN STRL REUS W/ TWL XL LVL3 (GOWN DISPOSABLE) ×1 IMPLANT
GOWN STRL REUS W/TWL LRG LVL3 (GOWN DISPOSABLE) ×2
GOWN STRL REUS W/TWL XL LVL3 (GOWN DISPOSABLE) ×2
HOOD PEEL AWAY FLYTE STAYCOOL (MISCELLANEOUS) ×6 IMPLANT
IMMBOLIZER KNEE 19 BLUE UNIV (SOFTGOODS) IMPLANT
KIT PREVENA INCISION MGT 13 (CANNISTER) ×3 IMPLANT
KIT RM TURNOVER STRD PROC AR (KITS) ×3 IMPLANT
KNEE MEDACTA TIBIAL/FEMORAL BL (Knees) ×3 IMPLANT
KNIFE SCULPS 14X20 (INSTRUMENTS) ×3 IMPLANT
NDL SAFETY 18GX1.5 (NEEDLE) ×3 IMPLANT
NEEDLE SPNL 18GX3.5 QUINCKE PK (NEEDLE) ×3 IMPLANT
NEEDLE SPNL 20GX3.5 QUINCKE YW (NEEDLE) ×3 IMPLANT
NS IRRIG 1000ML POUR BTL (IV SOLUTION) ×3 IMPLANT
PACK TOTAL KNEE (MISCELLANEOUS) ×3 IMPLANT
PAD WRAPON POLAR KNEE (MISCELLANEOUS) ×1 IMPLANT
PULSAVAC PLUS IRRIG FAN TIP (DISPOSABLE) ×3
SOL .9 NS 3000ML IRR  AL (IV SOLUTION) ×2
SOL .9 NS 3000ML IRR UROMATIC (IV SOLUTION) ×1 IMPLANT
STAPLER SKIN PROX 35W (STAPLE) ×3 IMPLANT
SUCTION FRAZIER HANDLE 10FR (MISCELLANEOUS) ×2
SUCTION TUBE FRAZIER 10FR DISP (MISCELLANEOUS) ×1 IMPLANT
SUT DVC 2 QUILL PDO  T11 36X36 (SUTURE) ×2
SUT DVC 2 QUILL PDO T11 36X36 (SUTURE) ×1 IMPLANT
SUT V-LOC 90 ABS DVC 3-0 CL (SUTURE) ×3 IMPLANT
SYR 20CC LL (SYRINGE) ×3 IMPLANT
SYR 50ML LL SCALE MARK (SYRINGE) ×6 IMPLANT
TIP FAN IRRIG PULSAVAC PLUS (DISPOSABLE) ×1 IMPLANT
TOWEL OR 17X26 4PK STRL BLUE (TOWEL DISPOSABLE) ×3 IMPLANT
TOWER CARTRIDGE SMART MIX (DISPOSABLE) ×3 IMPLANT
WND VAC CANISTER 500ML (MISCELLANEOUS) ×3 IMPLANT
WRAPON POLAR PAD KNEE (MISCELLANEOUS) ×3

## 2017-01-05 NOTE — Progress Notes (Signed)
PHARMACIST - PHYSICIAN ORDER COMMUNICATION  CONCERNING: P&T Medication Policy on Herbal Medications  DESCRIPTION:  This patient's order for:  CALCIUM-MAGNESUIUM-ZINC 333-133-8.3 MG TABS 1 tablet has been noted.  This product(s) is classified as an "herbal" or natural product. Due to a lack of definitive safety studies or FDA approval, nonstandard manufacturing practices, plus the potential risk of unknown drug-drug interactions while on inpatient medications, the Pharmacy and Therapeutics Committee does not permit the use of "herbal" or natural products of this type within [facility]   ACTION TAKEN: The pharmacy department is unable to verify this order at this time. Please reevaluate patient's clinical condition at discharge and address if the herbal or natural product(s) should be resumed at that time.  Pernell Dupre, PharmD, BCPS Clinical Pharmacist 01/05/2017 5:21 PM

## 2017-01-05 NOTE — Anesthesia Post-op Follow-up Note (Signed)
Anesthesia QCDR form completed.        

## 2017-01-05 NOTE — H&P (Signed)
Chief Complaint  Patient presents with  . Knee Pain  H&P L TKA sx 01/05/17   History of Present Illness: Vicki Raymond is a 83 y.o. female who comes to clinic today for history and physical for left total knee arthroplasty with Dr. Rudene Christians on 01/05/2017. Her prior x-rays obtained on 11/10/16 by Nani Gasser, PA showed severe lateral compartment osteoarthritis as well as advanced medial compartment arthritis on the flexion view. There was significant loss of cartilage with anterior and posterior osteophytes on the femoral and tibial condyles. There is complete loss patellofemoral joint space with large osteophytes in the femoral trochlea and patella. The patient had a Monovisc injection approximately 3 weeks ago that did not give much relief. She has had corticosteroid injections throughout the summer. She reports that her pain has become unbearable for her. She has been using a walker or cane because of this. Approximately 3 month ago, she was able to walk a half of a mile with no issues. She rates her pain at 9/10 today. Patient has seen Dr. Rudene Christians, agreed and consented to knee replacement.  She has a history of a heart murmur and is followed by Dr. Ubaldo Glassing.  Past Medical History: Past Medical History:  Diagnosis Date  . Adenocarcinoma of endometrium, stage 1 (CMS-HCC)  Stage 1 C grade 2  . B12 deficiency 07/19/2013  . Cataract cortical, senile 03/26/2014  . Chronic gouty arthritis 04/08/2016  . FH: colon cancer 10/31/2014  . H/O adenomatous polyp of colon 10/31/2014  . Hyperlipidemia, unspecified 02/23/1993  . Peripheral neuropathy 07/19/2013  . Stroke (CMS-HCC)  . Vitamin D deficiency disease, unspecified 07/19/2013   Past Surgical History: Past Surgical History:  Procedure Laterality Date  . ABDOMINAL SURGERY 03/26/2002  . BREAST SURGERY 1988  left breast lumpectomy-benign  . CATARACT EXTRACTION 03/26/2014  . CHOLECYSTECTOMY 1982  . COLONOSCOPY 09/20/2006  Adenomatous Polyp, [facility] (Father), FH  Colon Polyps (Son)  . COLONOSCOPY 10/17/2008  PH Adenomatous Polyp, [facility] (Father), FH Colon Polyps (Son)  . COLONOSCOPY 01/14/2015  PH Adenomatous Polyp, [facility] (Father), FH Colon Polyps (Son): No repeat due to age per RTE  . COLPOCLEISIS N/A 07/05/2015  Procedure: COLPOCLEISIS (LE FORT TYPE); Surgeon: Evaristo Bury, MD; Location: [facility]; Service: Gynecology; Laterality: N/A;  . COLPOSCOPY 12/25/2014  . CYSTOURETHROSCOPY N/A 07/05/2015  Procedure: CYSTOURETHROSCOPY (SEPARATE PROCEDURE); Surgeon: Evaristo Bury, MD; Location: [facility]; Service: Gynecology; Laterality: N/A;  . EGD 01/14/2015  No repeat per RTE  . GYNECOLOGIC CRYOSURGERY  . HERNIA REPAIR Left  inguinal  . HYSTERECTOMY 03/26/2002  . INSERT / REPLACE / REMOVE PACEMAKER  s/p dual chamber pacemaker 5/16  . JOINT REPLACEMENT Left 2004  total hip  . OOPHORECTOMY 03/26/2002  . PERINEOPLASTY N/A 07/05/2015  Procedure: PERINEOPLASTY, REPAIR OF PERINEUM, NONOBSTETRICAL (SEPARATE PROCEDURE); Surgeon: Evaristo Bury, MD; Location: [facility]; Service: Gynecology; Laterality: N/A;  . SLING FOR STRESS INCONTINENCE N/A 07/05/2015  Procedure: SLING OPERATION FOR STRESS INCONTINENCE (EG, FASCIA OR SYNTHETIC); Surgeon: Evaristo Bury, MD; Location: [facility]; Service: Gynecology; Laterality: N/A;  . TH/BSO  . TONSILLECTOMY   Past Family History: Family History  Problem Relation Age of Onset  . Breast cancer Mother  . Heart disease Mother  . High blood pressure (Hypertension) Mother  . Stroke Mother  . Hyperlipidemia (Elevated cholesterol) Mother  . Colon cancer Father  . High blood pressure (Hypertension) Father  . Osteoporosis (Thinning of bones) Father  . Hyperlipidemia (Elevated cholesterol) Father  . Breast cancer Sister  . Colon cancer  Paternal Uncle  . Colon polyps Son  . Diabetes Maternal Grandmother  . Breast cancer Sister  . Hyperlipidemia (Elevated cholesterol) Sister  . High blood pressure (Hypertension)  Sister  . Anesthesia problems Neg Hx   Medications: Current Outpatient Medications Ordered in Epic  Medication Sig Dispense Refill  . allopurinol (ZYLOPRIM) 300 MG tablet Take 1 tablet (300 mg total) by mouth once daily. 30 tablet 6  . aspirin 81 MG EC tablet Take 81 mg by mouth once daily.  Marland Kitchen CALCIUM-MAGNESIUM-ZINC ORAL Take 1 tablet by mouth once daily.  . cholecalciferol (VITAMIN D3) 2,000 unit tablet Take 2,000 Units by mouth once daily.  . cyanocobalamin, vitamin B-12, (VITAMIN B-12) 1,000 mcg/mL Drop Take 1,000 mcg by mouth once daily.  Marland Kitchen docusate (COLACE) 100 MG capsule Take 100 mg by mouth 2 (two) times daily.  . FUROsemide (LASIX) 20 MG tablet TAKE 1 TABLET BY MOUTH EVERY MORNING FOR FLUID 90 tablet 1  . gabapentin (NEURONTIN) 100 MG capsule TAKE 1 CAPSULE BY MOUTH 3 TIMES A DAY 270 capsule 1  . GLUCOSAMINE/CHONDROITIN SULF A (GLUCOSAMINE-CHONDROITIN ORAL) Take 1 tablet by mouth once daily.  Marland Kitchen HYDROcodone-acetaminophen (NORCO) 5-325 mg tablet Take 1 tablet by mouth every 6 (six) hours as needed for Pain. 35 tablet 0  . multivitamin tablet Take 1 tablet by mouth once daily.  Marland Kitchen MYRBETRIQ 50 mg ER tablet TAKE 1 TABLET (50 MG TOTAL) BY MOUTH ONCE DAILY. 4  . omeprazole (PRILOSEC) 20 MG DR capsule TAKE 1 CAPSULE BY MOUTH EVERY MORNING, 30 MINUTES BEFORE BREAKFAST. 90 capsule 3  . sertraline (ZOLOFT) 50 MG tablet Take 50 mg by mouth once daily.   No current Epic-ordered facility-administered medications on file.   Allergies: Allergies  Allergen Reactions  . Corn Anaphylaxis  . Other Anaphylaxis  Per patient she has anaphylaxis when ingesting corn products  . Ace Inhibitors Cough  . Arb-Angiotensin Receptor Antagonist Cough  . Cefazolin Unknown  . Keflex [Cephalexin] Itching and Rash  . Penicillin Itching and Rash    Body mass index is 27.55 kg/m.  Review of Systems:  A comprehensive 14 point ROS was performed, reviewed, and the pertinent orthopaedic findings are documented  in the HPI. Physical Exam: General/Constitutional: No apparent distress: well-nourished and well developed. Eyes: Pupils equal, round with synchronous movement. Lungs: Clear to auscultation HEENT: Normal Vascular: No edema, swelling or tenderness, except as noted in detailed exam. Cardiac: Heart rate and rhythm is regular. Murmur is auscultated on exam. Integumentary: No impressive skin lesions present, except as noted in detailed exam. Neuro/Psych: Normal mood and affect, oriented to person, place and time. Vitals:  01/04/17 0921  BP: 122/76   Orthopaedic Examination: Right Left   Gait Normal  Alignment Valgus deformity on the right that is passively correctable  Inspection Normal Normal  Palpation Knee Baker's cyst Normal  Range of Motion Knee 5-120 with crepitus Normal  Strength Normal Normal  Meniscus Exam Normal Normal  Ligament Exam Normal Normal  Patella Exam Normal Normal  Reflexes Normal Normal  Neurologic Normal Normal   Imaging Studies: The patient's x-rays were reviewed today as noted above. Patient has severe tricompartmental osteoarthritis with valgus deformity present.  Assessment:  ICD-10-CM ICD-9-CM  1. Primary localized osteoarthritis of left knee M17.12 715.16    Plan: 1. Risks, benefits, complications of a left total knee arthroplasty were discussed with the patient. Patient has agreed and consented to procedure with Dr. Rudene Christians on 01/05/2017.

## 2017-01-05 NOTE — OR Nursing (Signed)
Dr Rudene Christians aware of PCR screen results no new orders and discussed antibiotic and allergies with md no new orders

## 2017-01-05 NOTE — Anesthesia Preprocedure Evaluation (Signed)
Anesthesia Evaluation  Patient identified by MRN, date of birth, ID band Patient awake    Reviewed: Allergy & Precautions, NPO status , Patient's Chart, lab work & pertinent test results  History of Anesthesia Complications (+) PONV and history of anesthetic complications  Airway Mallampati: III       Dental   Pulmonary neg sleep apnea, neg COPD,           Cardiovascular hypertension, Pt. on medications (-) Past MI and (-) CHF (-) dysrhythmias + pacemaker + Valvular Problems/Murmurs (murmur)      Neuro/Psych neg Seizures Anxiety    GI/Hepatic Neg liver ROS, GERD  Medicated,  Endo/Other  neg diabetes  Renal/GU negative Renal ROS     Musculoskeletal   Abdominal   Peds  Hematology  (+) anemia ,   Anesthesia Other Findings   Reproductive/Obstetrics                             Anesthesia Physical Anesthesia Plan  ASA: III  Anesthesia Plan: Spinal   Post-op Pain Management:    Induction:   PONV Risk Score and Plan:   Airway Management Planned: Nasal Cannula  Additional Equipment:   Intra-op Plan:   Post-operative Plan:   Informed Consent: I have reviewed the patients History and Physical, chart, labs and discussed the procedure including the risks, benefits and alternatives for the proposed anesthesia with the patient or authorized representative who has indicated his/her understanding and acceptance.     Plan Discussed with:   Anesthesia Plan Comments:         Anesthesia Quick Evaluation

## 2017-01-05 NOTE — Transfer of Care (Signed)
Immediate Anesthesia Transfer of Care Note  Patient: Vicki Raymond  Procedure(s) Performed: TOTAL KNEE ARTHROPLASTY (Left )  Patient Location: PACU  Anesthesia Type:Spinal  Level of Consciousness: awake and patient cooperative  Airway & Oxygen Therapy: Patient Spontanous Breathing and Patient connected to face mask oxygen  Post-op Assessment: Report given to RN and Post -op Vital signs reviewed and stable  Post vital signs: Reviewed and stable  Last Vitals:  Vitals:   01/05/17 1105 01/05/17 1455  BP: (!) 176/95 (!) 94/54  Pulse: 80 62  Resp: 16 19  Temp: 37.1 C 37 C  SpO2: 100% 100%    Last Pain:  Vitals:   01/05/17 1105  TempSrc: Temporal         Complications: No apparent anesthesia complications

## 2017-01-05 NOTE — Op Note (Signed)
01/05/2017  2:52 PM  PATIENT:  Vicki Raymond  83 y.o. female  PRE-OPERATIVE DIAGNOSIS:  PRIMARY LOCALIZED OSTEOARTHRITIS OF LEFT KNEE  POST-OPERATIVE DIAGNOSIS:  PRIMARY LOCALIZED OSTEOARTHRITIS OF LEFT KNEE  PROCEDURE:  Procedure(s): TOTAL KNEE ARTHROPLASTY (Left)  SURGEON: Laurene Footman, MD  ASSISTANTS: Arvella Nigh [facility]  ANESTHESIA:   spinal  EBL:  Total I/O In: 900 [I.V.:900] Out: 450 [Urine:250; Blood:200]  BLOOD ADMINISTERED:none  DRAINS: none   LOCAL MEDICATIONS USED:  MARCAINE    and OTHER morphine and Exparel  SPECIMEN:  No Specimen  DISPOSITION OF SPECIMEN:  N/A  COUNTS:  YES  TOURNIQUET:  * Missing tourniquet times found for documented tourniquets in log: [id number] *26 minutes at 300 mmHg  IMPLANTS: Medacta GMK left 4 femur, 4 tibia with short stem and 10 mm PS insert, size 2 patella all components cemented  DICTATION: .Dragon Dictation  patient brought the operating room and after adequate spinal anesthesia was obtained leftleg was prepped and draped in sterile fashion. After patient identification and timeout procedures were completed midline skin incision was made followed by medial parapatellar arthrotomy. The knee revealedwear of the lateral and nearly as bad in the medial compartment both femoral and tibial condyles with exposed bone  with mild synovitis as well as exposed bone in the femoral trochlea and patella with significant bone loss on the patella. . The fat pad and anterior cruciate ligament and PCL were excised and the proximal tibia cutting guide was applied to the proximal tibia and axial tibia cut carried out followed by distal femur cut. The 4-in-1 cutting guide was applied to the femur anterior posterior and chamfer cuts made. The posterior horns of the menisci could be resected this time and the size4tibia baseplate trial was placed in apporpriote rotationand proximal reaming carried out for short stem. The keel punch was placed followed by the  55femoral trial and a 10mm PS insert gave good stability through range of motion. This was thenchosen as the final implant. Distal femur drill holes were made followed by the trochlear groove cut and PS drill hole and then trials were removed. The patella was affected with arthritis as welland resection was carried out drilling plate carried out and then a size 2patella fit well. These trials were all removed.. Thelocal anesthetic noted above was infiltrated in the para-articular tissue. The tourniquet was then raised and the bony surfaces thoroughly irrigated and dried. Tibial component was cemented in place first followed by the plastic insert and then the femoral component and the knee placed in extension with excess cement being removed. Next the patellar button was clamped into place and after the cement entirely set the clamp was removed the patella tracked well with the tourniquet down the knee was thoroughly irrigated and then closed with a heavy Quill with 3-0 locking suture followed by skin staples. Incisional wound VAC applied along with a Polar Care     PLAN OF CARE: Admit to inpatient   PATIENT DISPOSITION:  PACU - hemodynamically stable.

## 2017-01-06 ENCOUNTER — Encounter: Payer: Self-pay | Admitting: Orthopedic Surgery

## 2017-01-06 ENCOUNTER — Encounter
Admission: RE | Admit: 2017-01-06 | Discharge: 2017-01-06 | Disposition: A | Discharge status: Home / Self Care | Payer: Medicare Other | Source: Ambulatory Visit | Attending: Internal Medicine | Admitting: Internal Medicine

## 2017-01-06 LAB — BASIC METABOLIC PANEL
Anion gap: 5 (ref 5–15)
BUN: 15 mg/dL (ref 6–20)
CHLORIDE: 105 mmol/L (ref 101–111)
CO2: 25 mmol/L (ref 22–32)
CREATININE: 0.69 mg/dL (ref 0.44–1.00)
Calcium: 8.7 mg/dL — ABNORMAL LOW (ref 8.9–10.3)
GFR calc non Af Amer: >60 mL/min (ref >60)
Glucose, Bld: 118 mg/dL — ABNORMAL HIGH (ref 65–99)
POTASSIUM: 3.9 mmol/L (ref 3.5–5.1)
Sodium: 135 mmol/L (ref 135–145)

## 2017-01-06 LAB — CBC
HEMATOCRIT: 36.8 % (ref 35.0–47.0)
HEMOGLOBIN: 12.1 g/dL (ref 12.0–16.0)
MCH: 33.4 pg (ref 26.0–34.0)
MCHC: 32.9 g/dL (ref 32.0–36.0)
MCV: 101.5 fL — ABNORMAL HIGH (ref 80.0–100.0)
Platelets: 167 10*3/uL (ref 150–440)
RBC: 3.63 MIL/uL — ABNORMAL LOW (ref 3.80–5.20)
RDW: 15.1 % — ABNORMAL HIGH (ref 11.5–14.5)
WBC: 7.1 10*3/uL (ref 3.6–11.0)

## 2017-01-06 MED ORDER — CLONIDINE HCL 0.1 MG PO TABS
0.2000 mg | ORAL_TABLET | Freq: Two times a day (BID) | ORAL | Status: DC
  Administered 2017-01-06 – 2017-01-07 (×4): 0.2 mg via ORAL
  Filled 2017-01-06 (×5): qty 2

## 2017-01-06 MED ORDER — TRAMADOL HCL 50 MG PO TABS
50.0000 mg | ORAL_TABLET | ORAL | Status: AC | PRN
  Administered 2017-01-06 (×2): 100 mg via ORAL
  Administered 2017-01-07: 50 mg via ORAL
  Filled 2017-01-06: qty 2
  Filled 2017-01-06: qty 1
  Filled 2017-01-06: qty 2

## 2017-01-06 MED ORDER — SCOPOLAMINE 1 MG/3DAYS TD PT72
1.0000 | MEDICATED_PATCH | TRANSDERMAL | Status: DC
  Administered 2017-01-06: 1.5 mg via TRANSDERMAL
  Filled 2017-01-06: qty 1

## 2017-01-06 MED ORDER — TRAMADOL HCL 50 MG PO TABS
50.0000 mg | ORAL_TABLET | ORAL | Status: DC | PRN
  Administered 2017-01-06: 50 mg via ORAL
  Filled 2017-01-06: qty 1

## 2017-01-06 MED ORDER — AMLODIPINE BESYLATE 10 MG PO TABS
10.0000 mg | ORAL_TABLET | Freq: Every day | ORAL | Status: DC
  Administered 2017-01-06 – 2017-01-08 (×3): 10 mg via ORAL
  Filled 2017-01-06 (×3): qty 1

## 2017-01-06 NOTE — Clinical Social Work Note (Signed)
Clinical Social Work Assessment  Patient Details  Name: Vicki Raymond MRN: [id number] Date of Birth: 09/20/1933  Date of referral:  01/06/17               Reason for consult:  Facility Placement                Permission sought to share information with:  Chartered certified accountant granted to share information::  Yes, Verbal Permission Granted  Name::      [place]::   [place]  Relationship::     Contact Information:     Housing/Transportation Living arrangements for the past 2 months:  [place] of Information:  Patient, Adult Children Patient Interpreter Needed:  None Criminal Activity/Legal Involvement Pertinent to Current Situation/Hospitalization:  No - Comment as needed Significant Relationships:  Adult Children Lives with:  Self Do you feel safe going back to the place where you live?  Yes Need for family participation in patient care:  Yes (Comment)  Care giving concerns:  Patient lives in [place] alone and her husband recently passed away.    Social Worker assessment / plan:  Holiday representative (CSW) received SNF consult. PT is recommending SNF. CSW met with patient and her 2 female friends from church were at bedside. CSW introduced self and explained role of CSW department. Patient was alert and oriented X4 and was sitting up in the bed. Patient reported that she lives alone in [place] and her son Vicki Raymond is her primary contact. Patient reported that her husband recently passed away and they buried him on Sunday. CSW provided emotional support. CSW explained SNF process. Patient is agreeable to SNF search in [facility]. FL2 complete and faxed out.   CSW presented bed offers to patient and her son Vicki Raymond. They chose private room at [facility]. [facility] admissions coordinator at [facility] is aware of accepted bed offer. CSW will continue to follow and assist as needed.   Employment status:   Retired Nurse, adult PT Recommendations:  [place] / Referral to community resources:  [place]  Patient/Family's Response to care:  Patient is agreeable to go to [facility]   Patient/Family's Understanding of and Emotional Response to Diagnosis, Current Treatment, and Prognosis: Patient was very pleasant and thanked CSW for assistance.   Emotional Assessment Appearance:  Appears stated age Attitude/Demeanor/Rapport:    Affect (typically observed):  Accepting, Adaptable, Pleasant Orientation:  Oriented to Self, Oriented to Place, Oriented to  Time, Oriented to Situation Alcohol / Substance use:  Not Applicable Psych involvement (Current and /or in the community):  No (Comment)  Discharge Needs  Concerns to be addressed:  Discharge Planning Concerns Readmission within the last 30 days:  No Current discharge risk:  Dependent with Mobility Barriers to Discharge:  Continued Medical Work up   [employer], Veronia Beets, LCSW 01/06/2017, 4:11 PM

## 2017-01-06 NOTE — Evaluation (Signed)
Physical Therapy Evaluation Patient Details Name: Vicki Raymond MRN: [id number] DOB: 1933-12-21 Today's Date: 01/06/2017   History of Present Illness  Pt was admitted s/p L TKA.  PMH includes peripheral neuropathy, CVA, colon CA, gouty arthritis and a heart murmur.  Pt stated that she also has a hx of a Baker's cyst.  Clinical Impression  Pt is an 83 year old female who lives in a house alone.  Her husband passed away last week and her son is available to help her post D/C only part time due to his work schedule.  Pt was independent with bed mobility but required an AD or help from her son for ambulation prior to DC.  Pt experienced nausea throughout evaluation and required several rest breaks when performing bed mobility.  She required mod 2 person assist throughout bed mobility assessment.  Pt was too fatigued and nauseas to remain sitting at EOB.  RN administered pain medication prior to physical evaluation due to pt pain level having been reported high all morning.  Pt will continue to benefit from skilled PT with focus on mobility, ambulation, strengthening, ROM and tolerance to activity.    Follow Up Recommendations SNF(Pt's son is not available to full time to help and requested [facility] due to proximity to his home. )    Equipment Recommendations  (Pt has all needed equiptment due to prior THA.)    Recommendations for Other Services       Precautions / Restrictions Precautions Precautions: Knee;Fall Restrictions Weight Bearing Restrictions: Yes LLE Weight Bearing: Weight bearing as tolerated      Mobility  Bed Mobility Overal bed mobility: Needs Assistance Bed Mobility: Rolling;Sidelying to Sit;Supine to Sit;Sit to Supine Rolling: Min assist Sidelying to sit: Mod assist Supine to sit: Mod assist Sit to supine: Mod assist   General bed mobility comments: Pt required 2 person assist for scooting to EOB and 1 person assist for all other bed mobility.  PT assisted with  mobilizing L LE and offered VC's for hand placement for use of bedrail.  Pt required frequent breaks due to nausea.  Transfers Overall transfer level: (Pt was unable to perform transfer due to nausea and pain.)                  Ambulation/Gait Ambulation/Gait assistance: (Pt was unable to perform ambulation due to nausea and pain.)              Stairs            Wheelchair Mobility    Modified Rankin (Stroke Patients Only)       Balance Overall balance assessment: Modified Independent(Pt presented with weakness and required bilateral UE for support when sitting on EOB.)                                           Pertinent Vitals/Pain Pain Assessment: 0-10 Pain Score: 7  Pain Location: Pt states that she still feels pain in the popliteal region as she did when she had a Baker's cyst. Pain Descriptors / Indicators: Aching;Sharp Pain Intervention(s): Limited activity within patient's tolerance;Monitored during session;Premedicated before session;RN gave pain meds during session;Relaxation(Encouraged deep breathing throughout evaluation.)    Home Living Family/patient expects to be discharged to:: Private residence Living Arrangements: Children Available Help at Discharge: Family(Son states that his availability is limited due to work schedule.) Type of Home: [employer]  Home Access: Stairs to enter Entrance Stairs-Rails: Can reach both Entrance Stairs-Number of Steps: 1 Home Layout: One level Home Equipment: Walker - 2 wheels;Cane - single point Additional Comments: Pt primarily uses SPC or holds to son's arm.    Prior Function Level of Independence: Independent with assistive device(s)         Comments: Pt states that she did not need help with bed mobility prior to admission.     Hand Dominance        Extremity/Trunk Assessment   Upper Extremity Assessment Upper Extremity Assessment: Generalized weakness    Lower Extremity  Assessment Lower Extremity Assessment: Generalized weakness    Cervical / Trunk Assessment Cervical / Trunk Assessment: Kyphotic  Communication   Communication: No difficulties  Cognition Arousal/Alertness: Lethargic Behavior During Therapy: WFL for tasks assessed/performed Overall Cognitive Status: Within Functional Limits for tasks assessed                                        General Comments      Exercises Total Joint Exercises Ankle Circles/Pumps: AROM;10 reps;Both;Supine Heel Slides: AROM;Both;Supine;5 reps Straight Leg Raises: (5 supine R to assess baseline.)   Assessment/Plan    PT Assessment Patient needs continued PT services  PT Problem List Decreased strength;Decreased range of motion;Decreased activity tolerance;Decreased balance;Decreased mobility;Pain       PT Treatment Interventions DME instruction;Gait training;Stair training;Functional mobility training;Therapeutic activities;Therapeutic exercise;Balance training;Patient/family education    PT Goals (Current goals can be found in the Care Plan section)       Frequency BID   Barriers to discharge        Co-evaluation               AM-PAC PT "6 Clicks" Daily Activity  Outcome Measure Difficulty turning over in bed (including adjusting bedclothes, sheets and blankets)?: A Lot Difficulty moving from lying on back to sitting on the side of the bed? : A Lot Difficulty sitting down on and standing up from a chair with arms (e.g., wheelchair, bedside commode, etc,.)?: A Lot Help needed moving to and from a bed to chair (including a wheelchair)?: A Lot Help needed walking in hospital room?: A Lot Help needed climbing 3-5 steps with a railing? : A Lot 6 Click Score: 12    End of Session Equipment Utilized During Treatment: Oxygen Activity Tolerance: Patient limited by fatigue;Patient limited by pain(Limited by nausea.) Patient left: in bed;with call bell/phone within reach;with  family/visitor present Nurse Communication: Patient requests pain meds PT Visit Diagnosis: Muscle weakness (generalized) (M62.81);Pain Pain - Right/Left: Left Pain - part of body: Knee    Time: 1035-1100 PT Time Calculation (min) (ACUTE ONLY): 25 min   Charges:   PT Evaluation $PT Eval Moderate Complexity: 1 Mod     PT G Codes:   PT G-Codes **NOT FOR INPATIENT CLASS** Functional Assessment Tool Used: AM-PAC 6 Clicks Basic Mobility Functional Limitation: Changing and maintaining body position Changing and Maintaining Body Position Current Status (Z0258): At least 60 percent but less than 80 percent impaired, limited or restricted Changing and Maintaining Body Position Goal Status (N2778): At least 1 percent but less than 20 percent impaired, limited or restricted    Roxanne Gates, PT, DPT  Roxanne Gates 01/06/2017, 11:26 AM

## 2017-01-06 NOTE — Progress Notes (Signed)
   Subjective: 1 Day Post-Op Procedure(s) (LRB): TOTAL KNEE ARTHROPLASTY (Left) Patient reports pain as 10 on 0-10 scale.   Patient is well, and has had no acute complaints or problems Denies any CP, SOB, ABD pain. We will continue therapy today.    Objective: Vital signs in last 24 hours: Temp:  [97.9 F (36.6 C)-98.9 F (37.2 C)] 98.9 F (37.2 C) (11/14 0745) Pulse Rate:  [58-80] 66 (11/14 0745) Resp:  [11-32] 18 (11/14 0745) BP: (83-191)/(43-98) 191/74 (11/14 0745) SpO2:  [88 %-100 %] 99 % (11/14 0745) Weight:  [86.2 kg (190 lb)] 86.2 kg (190 lb) (11/13 1105)  Intake/Output from previous day: 11/13 0701 - 11/14 0700 In: 2262.5 [P.O.:100; I.V.:2162.5] Out: 700 [Urine:500; Blood:200] Intake/Output this shift: No intake/output data recorded.  Recent Labs    01/05/17 1739 01/06/17 0533  HGB 12.8 12.1   Recent Labs    01/05/17 1739 01/06/17 0533  WBC 9.8 7.1  RBC 3.89 3.63*  HCT 39.6 36.8  PLT 184 167   Recent Labs    01/05/17 1739 01/06/17 0533  NA  --  135  K  --  3.9  CL  --  105  CO2  --  25  BUN  --  15  CREATININE 0.60 0.69  GLUCOSE  --  118*  CALCIUM  --  8.7*   No results for input(s): LABPT, INR in the last 72 hours.  EXAM General - Patient is Alert, Appropriate and Oriented Extremity - Neurovascular intact Sensation intact distally Intact pulses distally Dorsiflexion/Plantar flexion intact No cellulitis present Compartment soft Dressing - dressing C/D/I and no drainage Motor Function - intact, moving foot and toes well on exam.   Past Medical History:  Diagnosis Date  . Anemia   . Anginal pain ([place])   . Anxiety   . Cancer [facility])    uterine, endometrial, cervix  . Complication of anesthesia    slow CNS response and decreased respirations; had to be intubated 1980' s from cholecystectomy  . Diverticulosis   . GERD (gastroesophageal reflux disease)   . Heart murmur   . Hypertension   . Peripheral neuropathy   . Pneumonia   .  PONV (postoperative nausea and vomiting)   . Presence of permanent cardiac pacemaker   . Shortness of breath dyspnea     Assessment/Plan:   1 Day Post-Op Procedure(s) (LRB): TOTAL KNEE ARTHROPLASTY (Left) Active Problems:   Primary localized osteoarthritis of left knee  Estimated body mass index is 27.26 kg/m as calculated from the following:   Height as of 12/22/16: 5\' 10"  (1.778 m).   Weight as of this encounter: 86.2 kg (190 lb). Advance diet Up with therapy  Needs BM Recheck labs in the am CM to assist with discharge Add ultram to pain regimen HTN, Likely secondary to pain, will continue to monitor  DVT Prophylaxis - Lovenox, Foot Pumps and TED hose Weight-Bearing as tolerated to left leg   T. Rachelle Hora, PA-C [place] 01/06/2017, 8:23 AM

## 2017-01-06 NOTE — Clinical Social Work Placement (Signed)
   CLINICAL SOCIAL WORK PLACEMENT  NOTE  Date:  01/06/2017  Patient Details  Name: Vicki Raymond MRN: [id number] Date of Birth: 1933-06-20  Clinical Social Work is seeking post-discharge placement for this patient at the [place] level of care (*CSW will initial, date and re-position this form in  chart as items are completed):  Yes   Patient/family provided with [place] Work Department's list of facilities offering this level of care within the geographic area requested by the patient (or if unable, by the patient's family).  Yes   Patient/family informed of their freedom to choose among providers that offer the needed level of care, that participate in Medicare, Medicaid or managed care program needed by the patient, have an available bed and are willing to accept the patient.  Yes   Patient/family informed of [place]'s ownership interest in [facility] and [facility], as well as of the fact that they are under no obligation to receive care at these facilities.  PASRR submitted to EDS on       PASRR number received on       Existing PASRR number confirmed on 01/06/17     FL2 transmitted to all facilities in geographic area requested by pt/family on 01/06/17     FL2 transmitted to all facilities within larger geographic area on       Patient informed that his/her managed care company has contracts with or will negotiate with certain facilities, including the following:        Yes   Patient/family informed of bed offers received.  Patient chooses bed at [facility] )     Physician recommends and patient chooses bed at      Patient to be transferred to   on  .  Patient to be transferred to facility by       Patient family notified on   of transfer.  Name of family member notified:        PHYSICIAN       Additional Comment:    _______________________________________________ Doris Mcgilvery, Veronia Beets, LCSW 01/06/2017,  4:10 PM

## 2017-01-06 NOTE — Progress Notes (Signed)
Physical Therapy Treatment Patient Details Name: Vicki Raymond MRN: [id number] DOB: [birth year] Today's Date: 01/06/2017    History of Present Illness Pt was admitted s/p L TKA.  PMH includes peripheral neuropathy, CVA, colon CA, gouty arthritis and a heart murmur.  Pt stated that she also has a hx of a Baker's cyst.    PT Comments    Pt was able to progress to standing and initiation of ambulation during second treatment today.  Pt required min-mod assist for bed mobility and min assist for STS transfers with VC's for hand placement.  Pt completed 5 ft of ambulation with report that she felt dizzy.  PT terminated gait training and instructed pt to sit on EOB, educating pt concerning sequencing for best body mechanics to perform sit to supine.  Pt was able to perform full sets of therapeutic exercises with no report of pain increase.  Pt will continue to benefit from skilled PT with focus on strength, ROM, functional mobility and pain management.   Follow Up Recommendations  Home health PT     Equipment Recommendations       Recommendations for Other Services       Precautions / Restrictions Precautions Precautions: Fall;Knee Restrictions Weight Bearing Restrictions: Yes LLE Weight Bearing: Weight bearing as tolerated    Mobility  Bed Mobility Overal bed mobility: Needs Assistance Bed Mobility: Rolling;Sidelying to Sit;Supine to Sit;Sit to Supine Rolling: Modified independent (Device/Increase time) Sidelying to sit: Min assist Supine to sit: Min assist Sit to supine: Mod assist   General bed mobility comments: Pt is able to perform bed mobility with min hand hold assist with the exception of sit to supine.  Pt requires mod assist to bring L LE over EOB for this.  Transfers Overall transfer level: Needs assistance Equipment used: Rolling walker (2 wheeled) Transfers: Sit to/from Stand Sit to Stand: Modified independent (Device/Increase time)         General  transfer comment: PT provided VC's for hand placement when using RW.  Pt is able to complete Sit to stand following assistance for initiation of movement.  Ambulation/Gait Ambulation/Gait assistance: Min guard Ambulation Distance (Feet): 5 Feet Assistive device: Rolling walker (2 wheeled) Gait Pattern/deviations: Step-to pattern;Trunk flexed;Decreased step length - right;Decreased stance time - left   Gait velocity interpretation: Below normal speed for age/gender General Gait Details: Pt presents with a slow, guarded gait and decreased stance time on L LE.  Pt took 3-4 rest breaks, stating that she was dizzy.   Stairs            Wheelchair Mobility    Modified Rankin (Stroke Patients Only)       Balance Overall balance assessment: Modified Independent                                          Cognition Arousal/Alertness: Awake/alert Behavior During Therapy: WFL for tasks assessed/performed Overall Cognitive Status: Within Functional Limits for tasks assessed                                        Exercises Total Joint Exercises Ankle Circles/Pumps: AROM;10 reps;Both;Supine Quad Sets: Strengthening;Left;10 reps;Supine Goniometric ROM: Knee Ext/Flex: L: -5-50 deg. Other Exercises Other Exercises: Pt/family educated in polar care mgt, compression stocking mgt/strategies for donning, and AE/DME for  seated bathing and dressing tasks to maximize safety and functional independence while minimizing falls risk. Pt/family verbalized understanding.    General Comments        Pertinent Vitals/Pain Pain Assessment: 0-10 Pain Score: 6  Pain Location: L knee Pain Descriptors / Indicators: Aching Pain Intervention(s): Limited activity within patient's tolerance;Monitored during session;Premedicated before session    Home Living Family/patient expects to be discharged to:: Private residence Living Arrangements: Children Available Help at  Discharge: Family;Available PRN/intermittently Type of Home: House Home Access: Stairs to enter Entrance Stairs-Rails: Can reach both Home Layout: One level Home Equipment: Walker - 2 wheels;Cane - single point;Shower seat;Hand held shower head;Adaptive equipment Additional Comments: Pt primarily uses SPC or holds to son's arm.    Prior Function Level of Independence: Independent with assistive device(s)      Comments: Pt states that she did not need help with bed mobility prior to admission. Endorses falls (tripped over rug)   PT Goals (current goals can now be found in the care plan section) Acute Rehab PT Goals Patient Stated Goal: To continue to improve strength and mobility. Progress towards PT goals: Progressing toward goals    Frequency    BID      PT Plan Current plan remains appropriate    Co-evaluation              AM-PAC PT "6 Clicks" Daily Activity  Outcome Measure  Difficulty turning over in bed (including adjusting bedclothes, sheets and blankets)?: A Little Difficulty moving from lying on back to sitting on the side of the bed? : A Lot Difficulty sitting down on and standing up from a chair with arms (e.g., wheelchair, bedside commode, etc,.)?: A Little Help needed moving to and from a bed to chair (including a wheelchair)?: A Lot Help needed walking in hospital room?: A Lot Help needed climbing 3-5 steps with a railing? : Total 6 Click Score: 13    End of Session Equipment Utilized During Treatment: Gait belt Activity Tolerance: Patient limited by fatigue;Patient limited by pain Patient left: in bed;with call bell/phone within reach;with bed alarm set;with family/visitor present Nurse Communication: Mobility status PT Visit Diagnosis: Unsteadiness on feet (R26.81);Other abnormalities of gait and mobility (R26.89);Muscle weakness (generalized) (M62.81);Difficulty in walking, not elsewhere classified (R26.2);Pain Pain - Right/Left: Left Pain - part  of body: Knee     Time: 9528-4132 PT Time Calculation (min) (ACUTE ONLY): 24 min  Charges:  $Therapeutic Exercise: 8-22 mins $Therapeutic Activity: 8-22 mins                    G Codes:  Functional Assessment Tool Used: AM-PAC 6 Clicks Basic Mobility    Roxanne Gates, PT, DPT   Roxanne Gates 01/06/2017, 5:24 PM

## 2017-01-06 NOTE — Anesthesia Postprocedure Evaluation (Signed)
Anesthesia Post Note  Patient: Vicki Raymond  Procedure(s) Performed: TOTAL KNEE ARTHROPLASTY (Left )  Patient location during evaluation: Nursing Unit Anesthesia Type: Spinal Level of consciousness: oriented, awake and patient cooperative Pain management: pain level controlled Vital Signs Assessment: post-procedure vital signs reviewed and stable Respiratory status: spontaneous breathing and respiratory function stable Cardiovascular status: blood pressure returned to baseline and stable Postop Assessment: no headache, no backache and no apparent nausea or vomiting Anesthetic complications: no     Last Vitals:  Vitals:   01/06/17 0358 01/06/17 0745  BP: (!) 146/65 (!) 191/74  Pulse: (!) 59 66  Resp: 16 18  Temp: 36.8 C 37.2 C  SpO2: 100% 99%    Last Pain:  Vitals:   01/06/17 0824  TempSrc:   PainSc: 8                  Silvana Newness A

## 2017-01-06 NOTE — Evaluation (Signed)
Occupational Therapy Evaluation Patient Details Name: Vicki Raymond MRN: [id number] DOB: Apr 19, 1933 Today's Date: 01/06/2017    History of Present Illness Pt was admitted s/p L TKA.  PMH includes peripheral neuropathy, CVA, colon CA, gouty arthritis and a heart murmur.  Pt stated that she also has a hx of a Baker's cyst.   Clinical Impression   Pt is 83 year old female s/p L TKR. Pt was independent in all ADLs prior to surgery and is eager to return to PLOF.  Pt currently requires max assist for LB dressing while in seated position due to pain and limited AROM of L knee. Pt/family educated in polar care mgt, compression stocking mgt/strategies for donning, and AE/DME for seated bathing and dressing tasks to maximize safety and functional independence while minimizing falls risk. Pt/family verbalized understanding. Pt would benefit from additional instruction in dressing techniques with or without assistive devices for dressing and bathing skills.  Pt would also benefit from recommendations for home modifications to increase safety in the bathroom and prevent falls. Recommend transition to STR to continue therapy.      Follow Up Recommendations  SNF    Equipment Recommendations  3 in 1 bedside commode    Recommendations for Other Services       Precautions / Restrictions Precautions Precautions: Knee;Fall;ICD/Pacemaker Restrictions Weight Bearing Restrictions: Yes LLE Weight Bearing: Weight bearing as tolerated      Mobility Bed Mobility               General bed mobility comments: deferred, pt in room to assess mobility further  Transfers                 General transfer comment: deferred, pt in room to assess mobility further    Balance                                           ADL either performed or assessed with clinical judgement   ADL Overall ADL's : Needs assistance/impaired Eating/Feeding: Sitting;Set up   Grooming:  Sitting;Set up   Upper Body Bathing: Sitting;Minimal assistance   Lower Body Bathing: Sitting/lateral leans;Maximal assistance;Moderate assistance   Upper Body Dressing : Sitting;Min guard   Lower Body Dressing: Sitting/lateral leans;Maximal assistance   Toilet Transfer: BSC;Stand-pivot;Maximal assistance                   Vision Baseline Vision/History: No visual deficits Patient Visual Report: No change from baseline       Perception     Praxis      Pertinent Vitals/Pain Pain Assessment: 0-10 Pain Score: 6  Pain Location: L knee Pain Descriptors / Indicators: Aching Pain Intervention(s): Limited activity within patient's tolerance;Monitored during session;Premedicated before session     Hand Dominance Right   Extremity/Trunk Assessment Upper Extremity Assessment Upper Extremity Assessment: Generalized weakness(grossly 4/5 bilaterally)   Lower Extremity Assessment Lower Extremity Assessment: Generalized weakness;Defer to PT evaluation   Cervical / Trunk Assessment Cervical / Trunk Assessment: Kyphotic   Communication Communication Communication: No difficulties   Cognition Arousal/Alertness: Awake/alert Behavior During Therapy: WFL for tasks assessed/performed Overall Cognitive Status: Within Functional Limits for tasks assessed                                     General Comments  Exercises Other Exercises Other Exercises: Pt/family educated in polar care mgt, compression stocking mgt/strategies for donning, and AE/DME for seated bathing and dressing tasks to maximize safety and functional independence while minimizing falls risk. Pt/family verbalized understanding.   Shoulder Instructions      Home Living Family/patient expects to be discharged to:: Private residence Living Arrangements: Children Available Help at Discharge: Family;Available PRN/intermittently Type of Home: House Home Access: Stairs to enter [employer] of Steps: 1 threshold Entrance Stairs-Rails: Can reach both Home Layout: One level     Bathroom Shower/Tub: Occupational psychologist: Standard Bathroom Accessibility: Yes   Home Equipment: Environmental consultant - 2 wheels;Cane - single point;Shower seat;Hand held shower head;Adaptive equipment Adaptive Equipment: Reacher Additional Comments: Pt primarily uses SPC or holds to son's arm.      Prior Functioning/Environment Level of Independence: Independent with assistive device(s)        Comments: Pt states that she did not need help with bed mobility prior to admission. Endorses falls (tripped over rug)        OT Problem List: Decreased strength;Pain;Decreased range of motion;Impaired balance (sitting and/or standing);Decreased activity tolerance      OT Treatment/Interventions: Self-care/ADL training;Therapeutic exercise;Therapeutic activities;DME and/or AE instruction;Patient/family education    OT Goals(Current goals can be found in the care plan section) Acute Rehab OT Goals Patient Stated Goal: get stronger OT Goal Formulation: With patient/family Time For Goal Achievement: 01/20/17 Potential to Achieve Goals: Good ADL Goals Pt Will Perform Lower Body Dressing: with set-up;with adaptive equipment;with min guard assist;sit to/from stand Pt Will Transfer to Toilet: with min guard assist;bedside commode;ambulating(LRAD for ambulation)  OT Frequency: Min 1X/week   Barriers to D/C:            Co-evaluation              AM-PAC PT "6 Clicks" Daily Activity     Outcome Measure Help from another person eating meals?: None Help from another person taking care of personal grooming?: None Help from another person toileting, which includes using toliet, bedpan, or urinal?: A Lot Help from another person bathing (including washing, rinsing, drying)?: A Lot Help from another person to put on and taking off regular upper body clothing?: A Little Help from another  person to put on and taking off regular lower body clothing?: A Lot 6 Click Score: 17   End of Session    Activity Tolerance: Patient limited by pain Patient left: in bed;with call bell/phone within reach;with bed alarm set;with family/visitor present;Other (comment)(PT in room for tx)  OT Visit Diagnosis: Other abnormalities of gait and mobility (R26.89);Pain Pain - Right/Left: Left Pain - part of body: Knee                Time: 7341-9379 OT Time Calculation (min): 22 min Charges:  OT General Charges $OT Visit: 1 Visit OT Evaluation $OT Eval Low Complexity: 1 Low OT Treatments $Self Care/Home Management : 8-22 mins G-Codes: OT G-codes **NOT FOR INPATIENT CLASS** Functional Assessment Tool Used: AM-PAC 6 Clicks Daily Activity;Clinical judgement Functional Limitation: Self care Self Care Current Status (K2409): At least 40 percent but less than 60 percent impaired, limited or restricted Self Care Goal Status (B3532): At least 20 percent but less than 40 percent impaired, limited or restricted   Jeni Salles, MPH, MS, OTR/L ascom [phone] 01/06/17, 3:37 PM

## 2017-01-06 NOTE — NC FL2 (Signed)
[place] LEVEL OF CARE SCREENING TOOL     IDENTIFICATION  Patient Name: Vicki Raymond Birthdate: July 29, 1933 Sex: female Admission Date (Current Location): 01/05/2017  [place] and [place] Number:  Engineering geologist and Address:  [facility], [address], [place], [place] [address]      Provider Number: [id number]  Attending Physician Name and Address:  Hessie Knows, MD  Relative Name and Phone Number:       Current Level of Care: Hospital Recommended Level of Care: [place] Prior Approval Number:    Date Approved/Denied:   PASRR Number: [id number])  Discharge Plan: SNF    Current Diagnoses: Patient Active Problem List   Diagnosis Date Noted  . Primary localized osteoarthritis of left knee 01/05/2017  . Sinus bradycardia 07/05/2014    Orientation RESPIRATION BLADDER Height & Weight     Self, Time, Situation, Place  O2(2 Liters Oxygen ) Continent Weight: 190 lb (86.2 kg) Height:     BEHAVIORAL SYMPTOMS/MOOD NEUROLOGICAL BOWEL NUTRITION STATUS      Continent Diet(Diet: Heart Healthy/ Carb Modified. )  AMBULATORY STATUS COMMUNICATION OF NEEDS Skin   Extensive Assist Verbally Surgical wounds, Wound Vac(Incision: Left Knee. Provena wound vac. )                       Personal Care Assistance Level of Assistance  Bathing, Feeding, Dressing Bathing Assistance: Limited assistance Feeding assistance: Independent Dressing Assistance: Limited assistance     Functional Limitations Info  Sight, Hearing, Speech Sight Info: Adequate Hearing Info: Adequate Speech Info: Adequate    SPECIAL CARE FACTORS FREQUENCY  PT (By licensed PT), OT (By licensed OT)     PT Frequency: (5) OT Frequency: (5)            Contractures      Additional Factors Info  Code Status, Allergies, Isolation Precautions Code Status Info: (Full Code. ) Allergies Info: (Corn-containing Products, Ace Inhibitors,  Angiotensin Receptor Blockers, Penicillins, Cefazolin, Keflex Cephalexin)     Isolation Precautions Info: (MRSA Nasal Swab. )     Current Medications (01/06/2017):  This is the current hospital active medication list Current Facility-Administered Medications  Medication Dose Route Frequency Provider Last Rate Last Dose  . 0.9 %  sodium chloride infusion   Intravenous Continuous Hessie Knows, MD 75 mL/hr at 01/05/17 2226    . acetaminophen (TYLENOL) tablet 650 mg  650 mg Oral Q4H PRN Hessie Knows, MD       Or  . acetaminophen (TYLENOL) suppository 650 mg  650 mg Rectal Q4H PRN Hessie Knows, MD      . allopurinol (ZYLOPRIM) tablet 300 mg  300 mg Oral Daily Hessie Knows, MD   300 mg at 01/06/17 6010  . amLODipine (NORVASC) tablet 5 mg  5 mg Oral Daily PRN Hessie Knows, MD      . aspirin EC tablet 81 mg  81 mg Oral Daily Hessie Knows, MD   81 mg at 01/06/17 0837  . bisacodyl (DULCOLAX) suppository 10 mg  10 mg Rectal Daily PRN Hessie Knows, MD      . cholecalciferol (VITAMIN D) tablet 2,000 Units  2,000 Units Oral Daily Hessie Knows, MD   2,000 Units at 01/06/17 [phone]  . clindamycin (CLEOCIN) 900 mg in sodium chloride 0.9 % 50 mL injection   Intravenous Once Hessie Knows, MD      . clindamycin (CLEOCIN) IVPB 900 mg  900 mg Intravenous Q6H Hessie Knows, MD  100 mL/hr at 01/06/17 0829 900 mg at 01/06/17 0829  . diphenhydrAMINE (BENADRYL) 12.5 MG/5ML elixir 12.5-25 mg  12.5-25 mg Oral Q4H PRN Hessie Knows, MD      . docusate sodium (COLACE) capsule 100 mg  100 mg Oral BID Hessie Knows, MD   100 mg at 01/06/17 0836  . enoxaparin (LOVENOX) injection 30 mg  30 mg Subcutaneous Q12H Hessie Knows, MD   30 mg at 01/06/17 0831  . furosemide (LASIX) tablet 20 mg  20 mg Oral Daily Hessie Knows, MD   20 mg at 01/06/17 0834  . gabapentin (NEURONTIN) capsule 100 mg  100 mg Oral TID Hessie Knows, MD   100 mg at 01/06/17 6283  . Influenza vac split quadrivalent PF (FLUZONE HIGH-DOSE) injection 0.5 mL   0.5 mL Intramuscular Tomorrow-1000 Hessie Knows, MD      . losartan (COZAAR) tablet 25 mg  25 mg Oral Daily PRN Hessie Knows, MD      . magnesium citrate solution 1 Bottle  1 Bottle Oral Once PRN Hessie Knows, MD      . magnesium hydroxide (MILK OF MAGNESIA) suspension 30 mL  30 mL Oral Daily PRN Hessie Knows, MD      . menthol-cetylpyridinium (CEPACOL) lozenge 3 mg  1 lozenge Oral PRN Hessie Knows, MD       Or  . phenol (CHLORASEPTIC) mouth spray 1 spray  1 spray Mouth/Throat PRN Hessie Knows, MD      . methocarbamol (ROBAXIN) tablet 500 mg  500 mg Oral Q6H PRN Hessie Knows, MD   500 mg at 01/05/17 1804   Or  . methocarbamol (ROBAXIN) 500 mg in dextrose 5 % 50 mL IVPB  500 mg Intravenous Q6H PRN Hessie Knows, MD      . metoCLOPramide (REGLAN) tablet 5-10 mg  5-10 mg Oral Q8H PRN Hessie Knows, MD       Or  . metoCLOPramide (REGLAN) injection 5-10 mg  5-10 mg Intravenous Q8H PRN Hessie Knows, MD      . mirabegron ER [facility]) tablet 50 mg  50 mg Oral Daily Hessie Knows, MD   50 mg at 01/06/17 1517  . morphine 2 MG/ML injection 2 mg  2 mg Intravenous Q1H PRN Hessie Knows, MD   2 mg at 01/06/17 0824  . multivitamin with minerals tablet 1 tablet  1 tablet Oral Daily Hessie Knows, MD   1 tablet at 01/06/17 [phone]  . ondansetron (ZOFRAN) tablet 4 mg  4 mg Oral Q6H PRN Hessie Knows, MD       Or  . ondansetron [facility]) injection 4 mg  4 mg Intravenous Q6H PRN Hessie Knows, MD   4 mg at 01/05/17 1909  . oxyCODONE (Oxy IR/ROXICODONE) immediate release tablet 10 mg  10 mg Oral Q3H PRN Hessie Knows, MD   10 mg at 01/06/17 7371  . oxyCODONE (Oxy IR/ROXICODONE) immediate release tablet 5 mg  5 mg Oral Q3H PRN Hessie Knows, MD   5 mg at 01/05/17 1941  . pantoprazole (PROTONIX) EC tablet 80 mg  80 mg Oral Daily Hessie Knows, MD   80 mg at 01/06/17 0626  . sertraline (ZOLOFT) tablet 50 mg  50 mg Oral Daily Hessie Knows, MD   50 mg at 01/06/17 0836  . traMADol (ULTRAM) tablet 50 mg  50 mg  Oral Q4H PRN Duanne Guess, PA-C      . vitamin B-12 (CYANOCOBALAMIN) tablet 1,000 mcg  1,000 mcg Oral Ceasar Lund, MD   1,000  mcg at 01/06/17 0834  . zolpidem (AMBIEN) tablet 5 mg  5 mg Oral QHS PRN Hessie Knows, MD         Discharge Medications: Please see discharge summary for a list of discharge medications.  Relevant Imaging Results:  Relevant Lab Results:   Additional Information (SSN: [id number])  Talyn Dessert, Veronia Beets, LCSW

## 2017-01-06 NOTE — Progress Notes (Signed)
Pt's BP 197/68  PA Arvella Nigh made aware.

## 2017-01-06 NOTE — Progress Notes (Signed)
BP 189/66 Losartan PRN given. Recheck BP 189/61. PA Arvella Nigh made aware.

## 2017-01-07 LAB — BASIC METABOLIC PANEL
Anion gap: 6 (ref 5–15)
BUN: 12 mg/dL (ref 6–20)
CHLORIDE: 102 mmol/L (ref 101–111)
CO2: 23 mmol/L (ref 22–32)
Calcium: 8.8 mg/dL — ABNORMAL LOW (ref 8.9–10.3)
Creatinine, Ser: 0.5 mg/dL (ref 0.44–1.00)
GFR calc Af Amer: >60 mL/min (ref >60)
GFR calc non Af Amer: >60 mL/min (ref >60)
Glucose, Bld: 132 mg/dL — ABNORMAL HIGH (ref 65–99)
POTASSIUM: 3.5 mmol/L (ref 3.5–5.1)
SODIUM: 131 mmol/L — AB (ref 135–145)

## 2017-01-07 LAB — CBC
HEMATOCRIT: 33 % — AB (ref 35.0–47.0)
Hemoglobin: 11 g/dL — ABNORMAL LOW (ref 12.0–16.0)
MCH: 33.4 pg (ref 26.0–34.0)
MCHC: 33.3 g/dL (ref 32.0–36.0)
MCV: 100.3 fL — AB (ref 80.0–100.0)
Platelets: 161 10*3/uL (ref 150–440)
RBC: 3.29 MIL/uL — AB (ref 3.80–5.20)
RDW: 14.8 % — ABNORMAL HIGH (ref 11.5–14.5)
WBC: 9.9 10*3/uL (ref 3.6–11.0)

## 2017-01-07 MED ORDER — ENOXAPARIN SODIUM 40 MG/0.4ML ~~LOC~~ SOLN: 40.0000 mg | SUBCUTANEOUS | 0 refills | Status: DC

## 2017-01-07 MED ORDER — TRAMADOL HCL 50 MG PO TABS: 50.0000 mg | ORAL_TABLET | ORAL | 1 refills | Status: DC | PRN

## 2017-01-07 MED ORDER — OXYCODONE HCL 5 MG PO TABS: 5.0000 mg | ORAL_TABLET | ORAL | 0 refills | Status: DC | PRN

## 2017-01-07 MED ORDER — LOSARTAN POTASSIUM 25 MG PO TABS
25.0000 mg | ORAL_TABLET | Freq: Every day | ORAL | Status: DC
  Administered 2017-01-08: 25 mg via ORAL
  Filled 2017-01-07: qty 1

## 2017-01-07 NOTE — Progress Notes (Addendum)
   Subjective: 2 Days Post-Op Procedure(s) (LRB): TOTAL KNEE ARTHROPLASTY (Left) Patient reports pain as moderate.   Patient is well, and has had no acute complaints or problems Denies any CP, SOB, ABD pain. We will continue therapy today.    Objective: Vital signs in last 24 hours: Temp:  [98 F (36.7 C)-98.9 F (37.2 C)] 98 F (36.7 C) (11/14 1930) Pulse Rate:  [66-74] 74 (11/14 1930) Resp:  [18-19] 19 (11/14 1930) BP: (175-197)/(58-74) 190/69 (11/14 1930) SpO2:  [95 %-99 %] 97 % (11/14 1930)  Intake/Output from previous day: 11/14 0701 - 11/15 0700 In: 1791.3 [I.V.:1791.3] Out: 700 [Urine:700] Intake/Output this shift: Total I/O In: 1791.3 [I.V.:1791.3] Out: 700 [Urine:700]  Recent Labs    01/05/17 1739 01/06/17 0533 01/07/17 0356  HGB 12.8 12.1 11.0*   Recent Labs    01/06/17 0533 01/07/17 0356  WBC 7.1 9.9  RBC 3.63* 3.29*  HCT 36.8 33.0*  PLT 167 161   Recent Labs    01/06/17 0533 01/07/17 0356  NA 135 131*  K 3.9 3.5  CL 105 102  CO2 25 23  BUN 15 12  CREATININE 0.69 0.50  GLUCOSE 118* 132*  CALCIUM 8.7* 8.8*   No results for input(s): LABPT, INR in the last 72 hours.  EXAM General - Patient is Alert, Appropriate and Oriented Extremity - Neurovascular intact Sensation intact distally Intact pulses distally Dorsiflexion/Plantar flexion intact No cellulitis present Compartment soft Dressing - dressing C/D/I and no drainage Motor Function - intact, moving foot and toes well on exam.   Past Medical History:  Diagnosis Date  . Anemia   . Anginal pain ([employer])   . Anxiety   . Cancer [facility])    uterine, endometrial, cervix  . Complication of anesthesia    slow CNS response and decreased respirations; had to be intubated 1980' s from cholecystectomy  . Diverticulosis   . GERD (gastroesophageal reflux disease)   . Heart murmur   . Hypertension   . Peripheral neuropathy   . Pneumonia   . PONV (postoperative nausea and vomiting)   .  Presence of permanent cardiac pacemaker   . Shortness of breath dyspnea     Assessment/Plan:   2 Days Post-Op Procedure(s) (LRB): TOTAL KNEE ARTHROPLASTY (Left) Active Problems:   Primary localized osteoarthritis of left knee  Estimated body mass index is 27.26 kg/m as calculated from the following:   Height as of 12/22/16: 5\' 10"  (1.778 m).   Weight as of this encounter: 86.2 kg (190 lb). Advance diet Up with therapy  Needs BM  CM to assist with discharge. Planning for [employer] on Friday. Add ultram to pain regimen HTN, will place medicine consult.   DVT Prophylaxis - Lovenox, Foot Pumps and TED hose Weight-Bearing as tolerated to left leg   Reche Dixon, PA-C [place] 01/07/2017, 6:40 AM

## 2017-01-07 NOTE — Progress Notes (Signed)
Physical Therapy Treatment Patient Details Name: Vicki Raymond MRN: [id number] DOB: 1934-02-07 Today's Date: 01/07/2017    History of Present Illness Pt was admitted s/p L TKA.  PMH includes peripheral neuropathy, CVA, colon CA, gouty arthritis and a heart murmur.  Pt stated that she also has a hx of a Baker's cyst.    PT Comments    Pt demonstrated improved knee flexion during PT treatment.  She experienced two incontinent episodes and required the use of a bedpan on two occasions during treatment.  During this time pt performed STS min assist and remained in room for ambulation.  She presented with increased fatigue following performance of bed mobility and STS transfers multiple times.  Pt followed VC's from PT but would require the same cues 2-3 min later.  Pt will continue to benefit from skilled PT with focus on strength, knee ROM, functional mobility and pain management.   Follow Up Recommendations  SNF     Equipment Recommendations       Recommendations for Other Services       Precautions / Restrictions Precautions Precautions: Knee;Fall Restrictions Weight Bearing Restrictions: Yes LLE Weight Bearing: Weight bearing as tolerated    Mobility  Bed Mobility Overal bed mobility: Needs Assistance Bed Mobility: Rolling;Supine to Sit;Sit to Supine Rolling: Min assist Sidelying to sit: Mod assist Supine to sit: Min assist Sit to supine: Min assist   General bed mobility comments: Pt required more assistance this afternoon, requiring multiple VC's for hand placement and to remain upright when performing Supine to sit.    Transfers Overall transfer level: Needs assistance Equipment used: Rolling walker (2 wheeled) Transfers: Sit to/from Stand Sit to Stand: Min assist         General transfer comment: Pt requires assistance to initiate STS and to maintain upright posture once standing.  Pt appeared confused 1-2x, grabbing PT's arm instead of the RW  handle.  Ambulation/Gait Ambulation/Gait assistance: Min guard Ambulation Distance (Feet): 20 Feet Assistive device: Rolling walker (2 wheeled)     Gait velocity interpretation: Below normal speed for age/gender General Gait Details: Low foot clearance, flexed posture, decreased stance time on L LE.  Pt demonstrated a more consistent reciprocal gait pattern at end of treatment when ambulating to chair.   Stairs            Wheelchair Mobility    Modified Rankin (Stroke Patients Only)       Balance Overall balance assessment: Needs assistance Sitting-balance support: Bilateral upper extremity supported                                        Cognition Arousal/Alertness: Lethargic Behavior During Therapy: WFL for tasks assessed/performed Overall Cognitive Status: Within Functional Limits for tasks assessed                                        Exercises Total Joint Exercises Quad Sets: Strengthening;Left;Supine;20 reps Heel Slides: AAROM;Left;20 reps;Supine Hip ABduction/ADduction: Strengthening;Left;10 reps;Supine Goniometric ROM: Knee Ext./Flex.: L: -5-60 degrees Other Exercises Other Exercises: Seated knee flexion: L, x15 Other Exercises: STS x5 min assist with RW    General Comments        Pertinent Vitals/Pain Pain Assessment: 0-10 Pain Score: 5  Pain Intervention(s): Limited activity within patient's tolerance;Monitored during session  Home Living                      Prior Function            PT Goals (current goals can now be found in the care plan section) Acute Rehab PT Goals Patient Stated Goal: To go to SNF and continue rehab. Progress towards PT goals: PT to reassess next treatment    Frequency    BID      PT Plan Current plan remains appropriate    Co-evaluation              AM-PAC PT "6 Clicks" Daily Activity  Outcome Measure  Difficulty turning over in bed (including  adjusting bedclothes, sheets and blankets)?: A Little Difficulty moving from lying on back to sitting on the side of the bed? : A Lot Difficulty sitting down on and standing up from a chair with arms (e.g., wheelchair, bedside commode, etc,.)?: A Lot Help needed moving to and from a bed to chair (including a wheelchair)?: A Lot Help needed walking in hospital room?: A Lot Help needed climbing 3-5 steps with a railing? : A Lot 6 Click Score: 13    End of Session Equipment Utilized During Treatment: Gait belt;Oxygen Activity Tolerance: Patient limited by fatigue Patient left: in chair;with call bell/phone within reach;with chair alarm set;with family/visitor present Nurse Communication: Mobility status PT Visit Diagnosis: Unsteadiness on feet (R26.81);Other abnormalities of gait and mobility (R26.89);Muscle weakness (generalized) (M62.81);Pain Pain - Right/Left: Left Pain - part of body: Knee     Time:  -     Charges:                       G Codes:       Roxanne Gates, PT, DPT   Roxanne Gates 01/07/2017, 3:45 PM

## 2017-01-07 NOTE — Discharge Instructions (Signed)
TOTAL KNEE REPLACEMENT POSTOPERATIVE DIRECTIONS  Knee Rehabilitation, Guidelines Following Surgery  Results after knee surgery are often greatly improved when you follow the exercise, range of motion and muscle strengthening exercises prescribed by your doctor. Safety measures are also important to protect the knee from further injury. Any time any of these exercises cause you to have increased pain or swelling in your knee joint, decrease the amount until you are comfortable again and slowly increase them. If you have problems or questions, call your caregiver or physical therapist for advice.   HOME CARE INSTRUCTIONS  Remove items at home which could result in a fall. This includes throw rugs or furniture in walking pathways.   ICE using the Polar Care unit to the affected knee every three hours for 30 minutes at a time and then as needed for pain and swelling.  Place a dry towel or pillow case over the knee before applying the Polar Care Unit.  Continue to use ice on the knee for pain and swelling from surgery. You may notice swelling that will progress down to the foot and ankle.  This is normal after surgery.  Elevate the leg when you are not up walking on it.    Continue to use the breathing machine which will help keep your temperature down.  It is common for your temperature to cycle up and down following surgery, especially at night when you are not up moving around and exerting yourself.  The breathing machine keeps your lungs expanded and your temperature down.  Do not place pillow under knee, focus on keeping the knee straight while resting  DIET You may resume your previous home diet once your are discharged from the hospital.  DRESSING / WOUND CARE / SHOWERING Keep the surgical dressing until follow up.  The dressing is water proof, so you can shower without any extra covering.  IF THE DRESSING FALLS OFF or the wound gets wet inside, change the dressing with sterile gauze.  Please use  good hand washing techniques before changing the dressing.  Do not use any lotions or creams on the incision until instructed by your surgeon.     The wound VAC will stay in place for 5 days. Able to shower with the wound VAC. After it is removed, nursing will apply a dry bandage.  You need to keep your wound dry after being discharged home.  Just keep the incision dry and apply a dry gauze dressing on daily. Change the surgical dressing only if needed and reapply a dry dressing each time.  ACTIVITY Walk with your walker as instructed. Use walker as long as suggested by your caregivers. Avoid periods of inactivity such as sitting longer than an hour when not asleep. This helps prevent blood clots.  You may resume a sexual relationship in one month or when given the OK by your doctor.  You may return to work once you are cleared by your doctor.  Do not drive a car for 6 weeks or until released by you surgeon.  Do not drive while taking narcotics.  WEIGHT BEARING Weight bearing as tolerated with assist device (walker, cane, etc) as directed, use it as long as suggested by your surgeon or therapist, typically at least 4-6 weeks.  POSTOPERATIVE CONSTIPATION PROTOCOL Constipation - defined medically as fewer than three stools per week and severe constipation as less than one stool per week.  One of the most common issues patients have following surgery is constipation.  Even if  you have a regular bowel pattern at home, your normal regimen is likely to be disrupted due to multiple reasons following surgery.  Combination of anesthesia, postoperative narcotics, change in appetite and fluid intake all can affect your bowels.  In order to avoid complications following surgery, here are some recommendations in order to help you during your recovery period.  Colace (docusate) - Pick up an over-the-counter form of Colace or another stool softener and take twice a day as long as you are requiring  postoperative pain medications.  Take with a full glass of water daily.  If you experience loose stools or diarrhea, hold the colace until you stool forms back up.  If your symptoms do not get better within 1 week or if they get worse, check with your doctor.  Dulcolax (bisacodyl) - Pick up over-the-counter and take as directed by the product packaging as needed to assist with the movement of your bowels.  Take with a full glass of water.  Use this product as needed if not relieved by Colace only.   MiraLax (polyethylene glycol) - Pick up over-the-counter to have on hand.  MiraLax is a solution that will increase the amount of water in your bowels to assist with bowel movements.  Take as directed and can mix with a glass of water, juice, soda, coffee, or tea.  Take if you go more than two days without a movement. Do not use MiraLax more than once per day. Call your doctor if you are still constipated or irregular after using this medication for 7 days in a row.  If you continue to have problems with postoperative constipation, please contact the office for further assistance and recommendations.  If you experience "the worst abdominal pain ever" or develop nausea or vomiting, please contact the office immediatly for further recommendations for treatment.  ITCHING  If you experience itching with your medications, try taking only a single pain pill, or even half a pain pill at a time.  You can also use Benadryl over the counter for itching or also to help with sleep.   TED HOSE STOCKINGS Wear the elastic stockings on both legs for six weeks following surgery during the day but you may remove then at night for sleeping.  MEDICATIONS See your medication summary on the After Visit Summary that the nursing staff will review with you prior to discharge.  You may have some home medications which will be placed on hold until you complete the course of blood thinner medication.  It is important for you to  complete the blood thinner medication as prescribed by your surgeon.  Continue your approved medications as instructed at time of discharge.  PRECAUTIONS If you experience chest pain or shortness of breath - call 911 immediately for transfer to the hospital emergency department.  If you develop a fever greater that 101 F, purulent drainage from wound, increased redness or drainage from wound, foul odor from the wound/dressing, or calf pain - CONTACT YOUR SURGEON.                                                   FOLLOW-UP APPOINTMENTS Make sure you keep all of your appointments after your operation with your surgeon and caregivers. You should call the office at the above phone number and make an appointment for approximately two weeks  after the date of your surgery or on the date instructed by your surgeon outlined in the "After Visit Summary".   RANGE OF MOTION AND STRENGTHENING EXERCISES  Rehabilitation of the knee is important following a knee injury or an operation. After just a few days of immobilization, the muscles of the thigh which control the knee become weakened and shrink (atrophy). Knee exercises are designed to build up the tone and strength of the thigh muscles and to improve knee motion. Often times heat used for twenty to thirty minutes before working out will loosen up your tissues and help with improving the range of motion but do not use heat for the first two weeks following surgery. These exercises can be done on a training (exercise) mat, on the floor, on a table or on a bed. Use what ever works the best and is most comfortable for you Knee exercises include:  Leg Lifts - While your knee is still immobilized in a splint or cast, you can do straight leg raises. Lift the leg to 60 degrees, hold for 3 sec, and slowly lower the leg. Repeat 10-20 times 2-3 times daily. Perform this exercise against resistance later as your knee gets better.  Quad and Hamstring Sets - Tighten up the  muscle on the front of the thigh (Quad) and hold for 5-10 sec. Repeat this 10-20 times hourly. Hamstring sets are done by pushing the foot backward against an object and holding for 5-10 sec. Repeat as with quad sets.   Leg Slides: Lying on your back, slowly slide your foot toward your buttocks, bending your knee up off the floor (only go as far as is comfortable). Then slowly slide your foot back down until your leg is flat on the floor again.  Angel Wings: Lying on your back spread your legs to the side as far apart as you can without causing discomfort.  A rehabilitation program following serious knee injuries can speed recovery and prevent re-injury in the future due to weakened muscles. Contact your doctor or a physical therapist for more information on knee rehabilitation.   IF YOU ARE TRANSFERRED TO A SKILLED REHAB FACILITY If the patient is transferred to a skilled rehab facility following release from the hospital, a list of the current medications will be sent to the facility for the patient to continue.  When discharged from the skilled rehab facility, please have the facility set up the patient's [place] prior to being released. Also, the skilled facility will be responsible for providing the patient with their medications at time of release from the facility to include their pain medication, the muscle relaxants, and their blood thinner medication. If the patient is still at the rehab facility at time of the two week follow up appointment, the skilled rehab facility will also need to assist the patient in arranging follow up appointment in our office and any transportation needs.  MAKE SURE YOU:  Understand these instructions.  Get help right away if you are not doing well or get worse.    Pick up stool softner and laxative for home use following surgery while on pain medications. Do not submerge incision under water. Please use good hand washing techniques while  changing dressing each day. May shower starting three days after surgery. Please use a clean towel to pat the incision dry following showers. Continue to use ice for pain and swelling after surgery. Do not use any lotions or creams on the incision until instructed  by your surgeon.

## 2017-01-07 NOTE — Plan of Care (Signed)
  Progressing Pain Managment: General experience of comfort will improve 01/07/2017 1844 - Progressing by Lawrence Santiago, Lin Givens, RN Safety: Ability to remain free from injury will improve 01/07/2017 1844 - Progressing by Lawrence Santiago, Lin Givens, RN Clinical Measurements: Respiratory complications will improve 01/07/2017 1844 - Progressing by Lawrence Santiago, Lin Givens, RN Activity: Risk for activity intolerance will decrease 01/07/2017 1844 - Progressing by Lawrence Santiago, Lin Givens, RN Elimination: Will not experience complications related to urinary retention 01/07/2017 1844 - Progressing by Lawrence Santiago, Lin Givens, RN Note Patient has been voiding fine throughout the day with no complications. However, urine was noted to be dark/concentrated color. Patient was encourage to drink more fluids.  Clinical Measurements: Postoperative complications will be avoided or minimized 01/07/2017 1844 - Progressing by Lawrence Santiago, Lin Givens, RN

## 2017-01-07 NOTE — Progress Notes (Signed)
Physical Therapy Treatment Patient Details Name: Vicki Raymond MRN: [id number] DOB: 02/01/1934 Today's Date: 01/07/2017    History of Present Illness Pt was admitted s/p L TKA.  PMH includes peripheral neuropathy, CVA, colon CA, gouty arthritis and a heart murmur.  Pt stated that she also has a hx of a Baker's cyst.    PT Comments    Pt progressed bed exercises and added seated exercises today.  She was also able to ambulate in room, to door and back to chair with CGA.  Pt requires several VC's for sequencing and placement when performing transfers and stand to sit for awareness of bed in proximity to patient position.  Pt reported 2/10 pain and no pain increase during therapeutic exercise.  Pt requires assistance to initiate movement during supine therapeutic exercise but is able to complete range following.  Pt will continue to benefit from skilled PT with focus on strengthening, ROM, tolerance to activity and functional mobility.   Follow Up Recommendations  SNF     Equipment Recommendations       Recommendations for Other Services       Precautions / Restrictions Precautions Precautions: Knee;Fall Restrictions Weight Bearing Restrictions: Yes LLE Weight Bearing: Weight bearing as tolerated    Mobility  Bed Mobility Overal bed mobility: Needs Assistance Bed Mobility: Rolling;Supine to Sit;Sit to Supine Rolling: Supervision Sidelying to sit: Min assist Supine to sit: Min assist Sit to supine: Min guard   General bed mobility comments: Pt requires min A for scooting to EOB; required increased time but was able to progress to feet on floor after PT helped to initiate movement.  Requires frequent VC's for hand placement and body mechanics.  Transfers Overall transfer level: Needs assistance Equipment used: Rolling walker (2 wheeled) Transfers: Sit to/from [facility] Sit to Stand: Min assist Stand pivot transfers: Min guard       General transfer  comment: Pt requires min A for initiation of STS and frequent VC's for upright posture, hand placement and sequencing.  Pt stops every 2-3 steps during stand pivot transfer and requires VC's to continue to progress.  Pt tries to sit down before clearing bed rail to sit on EOB.  Ambulation/Gait Ambulation/Gait assistance: Min guard Ambulation Distance (Feet): 30 Feet Assistive device: Rolling walker (2 wheeled)     Gait velocity interpretation: Below normal speed for age/gender General Gait Details: Pt presents with low shuffling steps, decreased stance time on L LE and forward flexed posture.   Stairs            Wheelchair Mobility    Modified Rankin (Stroke Patients Only)       Balance Overall balance assessment: Needs assistance Sitting-balance support: Bilateral upper extremity supported     Postural control: (Anterior lean) Standing balance support: Bilateral upper extremity supported                                Cognition Arousal/Alertness: Awake/alert Behavior During Therapy: WFL for tasks assessed/performed Overall Cognitive Status: Within Functional Limits for tasks assessed                                        Exercises Total Joint Exercises Ankle Circles/Pumps: AROM;Both;20 reps;Supine Quad Sets: Strengthening;Left;10 reps;Supine Short Arc Quad: AAROM;Left;10 reps;Supine Hip ABduction/ADduction: Strengthening;Left;10 reps;Supine Straight Leg Raises: AAROM;Left;5 reps;Supine Long Arc  Quad: 5 reps;AAROM;Seated Other Exercises Other Exercises: Seated march x10 BLE    General Comments        Pertinent Vitals/Pain Pain Assessment: 0-10 Pain Score: 2  Pain Descriptors / Indicators: Aching Pain Intervention(s): Limited activity within patient's tolerance;RN gave pain meds during session;Monitored during session    Home Living                      Prior Function            PT Goals (current goals can now  be found in the care plan section) Acute Rehab PT Goals Patient Stated Goal: Pt would like to go to SNF and continue rehab. Progress towards PT goals: Progressing toward goals    Frequency    BID      PT Plan Current plan remains appropriate    Co-evaluation              AM-PAC PT "6 Clicks" Daily Activity  Outcome Measure  Difficulty turning over in bed (including adjusting bedclothes, sheets and blankets)?: A Little Difficulty moving from lying on back to sitting on the side of the bed? : A Lot Difficulty sitting down on and standing up from a chair with arms (e.g., wheelchair, bedside commode, etc,.)?: A Little Help needed moving to and from a bed to chair (including a wheelchair)?: A Little Help needed walking in hospital room?: A Little Help needed climbing 3-5 steps with a railing? : A Lot 6 Click Score: 16    End of Session Equipment Utilized During Treatment: Gait belt;Oxygen Activity Tolerance: Patient limited by fatigue;No increased pain Patient left: in bed;with call bell/phone within reach(Physicain in room.) Nurse Communication: Patient requests pain meds PT Visit Diagnosis: Unsteadiness on feet (R26.81);Other abnormalities of gait and mobility (R26.89);Muscle weakness (generalized) (M62.81);Pain Pain - Right/Left: Left Pain - part of body: Knee     Time: 3846-6599 PT Time Calculation (min) (ACUTE ONLY): 30 min  Charges:  $Gait Training: 8-22 mins $Therapeutic Exercise: 8-22 mins                    G Codes:  Functional Assessment Tool Used: AM-PAC 6 Clicks Basic Mobility   Roxanne Gates, PT, DPT   Roxanne Gates 01/07/2017, 10:05 AM

## 2017-01-07 NOTE — Consult Note (Signed)
Patient Demographics  Vicki Raymond, is a 83 y.o. female   MRN: [id number]   DOB - 1933-06-19  Admit Date - 01/05/2017    Outpatient Primary MD for the patient is Rusty Aus, MD  Consult requested in the Hospital by Hessie Knows, MD, On 01/07/2017    Reason for consult ; medical management of hypertension   HPI:  83 y.o. female patient admitted to the service for left total knee arthroplasty, postop day 2 today.  We are consulted for management of hypertension.  Patient has been having elevated blood.  Up to 190/69.  All her BP medicines are started back except amlodipine.  She denies any complaints, just physical therapy.   With History of -  Past Medical History:  Diagnosis Date  . Anemia   . Anginal pain ([place])   . Anxiety   . Cancer [facility])    uterine, endometrial, cervix  . Complication of anesthesia    slow CNS response and decreased respirations; had to be intubated 1980' s from cholecystectomy  . Diverticulosis   . GERD (gastroesophageal reflux disease)   . Heart murmur   . Hypertension   . Peripheral neuropathy   . Pneumonia   . PONV (postoperative nausea and vomiting)   . Presence of permanent cardiac pacemaker   . Shortness of breath dyspnea       Past Surgical History:  Procedure Laterality Date  . ABDOMINAL HYSTERECTOMY    . BREAST BIOPSY Left 1980's  . BREAST SURGERY Left   . CATARACT EXTRACTION W/ INTRAOCULAR LENS  IMPLANT, BILATERAL    . CHOLECYSTECTOMY    . COLONOSCOPY WITH PROPOFOL N/A 01/14/2015   Procedure: COLONOSCOPY WITH PROPOFOL;  Surgeon: Manya Silvas, MD;  [facility] ENDOSCOPY;  Service: Endoscopy;  Laterality: N/A;  . ESOPHAGOGASTRODUODENOSCOPY (EGD) WITH PROPOFOL N/A 01/14/2015   Procedure: ESOPHAGOGASTRODUODENOSCOPY (EGD) WITH PROPOFOL;  Surgeon: Manya Silvas, MD;  Location: [facility] ENDOSCOPY;  Service: Endoscopy;  Laterality: N/A;  . EYE SURGERY    . HERNIA REPAIR  2010   with mesh  . INSERT / REPLACE / REMOVE PACEMAKER  03/2014   Dr. Saralyn Pilar  . JOINT REPLACEMENT Left    Left hip replacement  . PACEMAKER LEAD REMOVAL N/A 07/05/2014   Procedure: Pacemaker insertion ;  Surgeon: Isaias Cowman, MD;  Location: [facility] ORS;  Service: Cardiovascular;  Laterality: N/A;  . TONSILLECTOMY    . TOTAL KNEE ARTHROPLASTY Left 01/05/2017   Procedure: TOTAL KNEE ARTHROPLASTY;  Surgeon: Hessie Knows, MD;  Location: [facility] ORS;  Service: Orthopedics;  Laterality: Left;    in for   No chief complaint on file.      Review of Systems    In addition to the HPI above,  No Fever-chills, No Headache, No changes with Vision or hearing, No problems swallowing food or Liquids, No Chest pain, Cough or Shortness of Breath, No Abdominal pain, No Nausea or Vommitting, Bowel movements are regular, No Blood in stool or Urine, No dysuria, No new  skin rashes or bruises, No new joints pains-aches, status post left knee arthroplasty. No new weakness, tingling, numbness in any extremity, No recent weight gain or loss, No polyuria, polydypsia or polyphagia, No significant Mental Stressors.  A full 10 point Review of Systems was done, except as stated above, all other Review of Systems were negative.   Social History Social History   Tobacco Use  . Smoking status: Never Smoker  . Smokeless tobacco: Never Used  Substance Use Topics  . Alcohol use: No     Family History Family History  Problem Relation Age of Onset  . Hypertension Mother   . Breast cancer Mother   . Hypertension Father   . Stomach cancer Father   . Hypertension Sister   . Hypertension Brother   . Hypertension Other   . Diabetes Mellitus II Maternal Grandmother      Prior to Admission medications   Medication Sig Start Date End Date Taking? Authorizing Provider   acetaminophen (TYLENOL) 325 MG tablet Take 325 mg by mouth every 8 (eight) hours as needed for moderate pain.   Yes [provider]  allopurinol (ZYLOPRIM) 300 MG tablet Take 300 mg by mouth daily.   Yes [provider]  amLODipine (NORVASC) 5 MG tablet Take 1 tablet (5 mg total) by mouth daily. Patient taking differently: Take 5 mg by mouth daily as needed (for elevated BP).  07/06/14  Yes Paraschos, Alexander, MD  aspirin EC 81 MG tablet Take 81 mg by mouth daily.   Yes [provider]  CALCIUM-MAGNESIUM-ZINC PO Take 1 tablet by mouth daily.   Yes [provider]  Cholecalciferol (VITAMIN D3) 2000 units TABS Take 2,000 Units by mouth daily.   Yes [provider]  Cyanocobalamin 1000 MCG/ML LIQD Take 1 mL by mouth every other day.    Yes [provider]  docusate sodium (COLACE) 100 MG capsule Take 100 mg by mouth 2 (two) times daily.    Yes [provider]  furosemide (LASIX) 20 MG tablet Take 20 mg by mouth daily.   Yes [provider]  gabapentin (NEURONTIN) 100 MG capsule Take 100 mg by mouth 3 (three) times daily.   Yes [provider]  GLUCOSAMINE-CHONDROITIN PO Take 1 tablet by mouth daily.   Yes [provider]  HYDROcodone-acetaminophen (NORCO/VICODIN) 5-325 MG tablet Take 0.5 tablets by mouth 2 (two) times daily as needed for moderate pain.   Yes [provider]  losartan (COZAAR) 25 MG tablet Take 25 mg by mouth daily as needed (for elevated BP).    Yes [provider]  mirabegron ER (MYRBETRIQ) 50 MG TB24 tablet Take 50 mg by mouth daily.   Yes [provider]  Multiple Vitamin (MULTIVITAMIN) tablet Take 1 tablet by mouth daily.   Yes [provider]  omeprazole (PRILOSEC) 20 MG capsule Take 20 mg by mouth daily.   Yes [provider]  sertraline (ZOLOFT) 50 MG tablet Take 50 mg by mouth daily. 11/09/16  Yes [provider]  enoxaparin  (LOVENOX) 40 MG/0.4ML injection Inject 0.4 mLs (40 mg total) daily into the skin. 01/07/17   Reche Dixon, PA-C  oxyCODONE (OXY IR/ROXICODONE) 5 MG immediate release tablet Take 1 tablet (5 mg total) every 4 (four) hours as needed by mouth for moderate pain ((score 4 to 6)). 01/07/17   Reche Dixon, PA-C  traMADol (ULTRAM) 50 MG tablet Take 1-2 tablets (50-100 mg total) every 4 (four) hours as needed by mouth for moderate  pain. 01/07/17   Reche Dixon, PA-C    Anti-infectives (From admission, onward)   Start     Dose/Rate Route Frequency Ordered Stop   01/05/17 1800  clindamycin (CLEOCIN) IVPB 900 mg    Comments:  In normal saline   900 mg 100 mL/hr over 30 Minutes Intravenous Every 6 hours 01/05/17 1700 01/06/17 0900   01/05/17 1130  clindamycin (CLEOCIN) 900 mg in sodium chloride 0.9 % 50 mL injection     100 mL/hr over 30 Minutes Intravenous  Once 01/05/17 1118     01/05/17 1100  clindamycin (CLEOCIN) IVPB 900 mg  Status:  Discontinued     900 mg 100 mL/hr over 30 Minutes Intravenous  Once 01/05/17 1052 01/05/17 1118      Scheduled Meds: . allopurinol  300 mg Oral Daily  . amLODipine  10 mg Oral Daily  . aspirin EC  81 mg Oral Daily  . cholecalciferol  2,000 Units Oral Daily  . cloNIDine  0.2 mg Oral BID  . docusate sodium  100 mg Oral BID  . enoxaparin (LOVENOX) injection  30 mg Subcutaneous Q12H  . furosemide  20 mg Oral Daily  . gabapentin  100 mg Oral TID  . Influenza vac split quadrivalent PF  0.5 mL Intramuscular Tomorrow-1000  . mirabegron ER  50 mg Oral Daily  . multivitamin with minerals  1 tablet Oral Daily  . pantoprazole  80 mg Oral Daily  . scopolamine  1 patch Transdermal Q72H  . sertraline  50 mg Oral Daily  . vitamin B-12  1,000 mcg Oral QODAY   Continuous Infusions: . sodium chloride 75 mL/hr at 01/05/17 2226  . small volume/piggyback Museum/gallery curator    . methocarbamol (ROBAXIN)  IV      Allergies  Allergen Reactions  . Corn-Containing Products Anaphylaxis  .  Ace Inhibitors Cough  . Angiotensin Receptor Blockers Cough  . Penicillins Hives    Has patient had a PCN reaction causing immediate rash, facial/tongue/throat swelling, SOB or lightheadedness with hypotension: Yes Has patient had a PCN reaction causing severe rash involving mucus membranes or skin necrosis: No Has patient had a PCN reaction that required hospitalization: No Has patient had a PCN reaction occurring within the last 10 years: No If all of the above answers are "NO", then may proceed with Cephalosporin use.   . Cefazolin Rash  . Keflex [Cephalexin] Itching and Rash    Physical Exam  Vitals  Blood pressure (!) 132/49, pulse 61, temperature 99.3 F (37.4 C), temperature source Oral, resp. rate 20, weight 86.2 kg (190 lb), SpO2 98 %.   1. General alert, awake, oriented. 2. Normal affect and insight, Not Suicidal or Homicidal, Awake Alert, Oriented X 3.  3. No F.N deficits, ALL C.Nerves Intact, Strength 5/5 all 4 extremities, Sensation intact all 4 extremities, Plantars down going.  4. Ears and Eyes appear Normal, Conjunctivae clear, PERRLA. Moist Oral Mucosa.  5. Supple Neck, No JVD, No cervical lymphadenopathy appriciated, No Carotid Bruits.  6. Symmetrical Chest wall movement, Good air movement bilaterally, CTAB.  7. RRR, No Gallops, Rubs or Murmurs, No Parasternal Heave.  8. Positive Bowel Sounds, Abdomen Soft, No tenderness, No organomegaly appriciated,No rebound -guarding or rigidity.  9.  No Cyanosis, Normal Skin Turgor, No Skin Rash or Bruise.  10. Good muscle tone,  joints appear normal , no effusions, Normal ROM.  11. No Palpable Lymph Nodes in Neck or Axillae    Data Review  CBC Recent Labs  Lab 01/05/17  1739 01/06/17 0533 01/07/17 0356  WBC 9.8 7.1 9.9  HGB 12.8 12.1 11.0*  HCT 39.6 36.8 33.0*  PLT 184 167 161  MCV 101.7* 101.5* 100.3*  MCH 32.8 33.4 33.4  MCHC 32.3 32.9 33.3  RDW 14.9* 15.1* 14.8*    ------------------------------------------------------------------------------------------------------------------  Chemistries  Recent Labs  Lab 01/05/17 1739 01/06/17 0533 01/07/17 0356  NA  --  135 131*  K  --  3.9 3.5  CL  --  105 102  CO2  --  25 23  GLUCOSE  --  118* 132*  BUN  --  15 12  CREATININE 0.60 0.69 0.50  CALCIUM  --  8.7* 8.8*   ------------------------------------------------------------------------------------------------------------------ estimated creatinine clearance is 65.8 mL/min (by C-G formula based on SCr of 0.5 mg/dL). ------------------------------------------------------------------------------------------------------------------ No results for input(s): TSH, T4TOTAL, T3FREE, THYROIDAB in the last 72 hours.  Invalid input(s): FREET3   Coagulation profile No results for input(s): INR, PROTIME in the last 168 hours. ------------------------------------------------------------------------------------------------------------------- No results for input(s): DDIMER in the last 72 hours. -------------------------------------------------------------------------------------------------------------------  Cardiac Enzymes No results for input(s): CKMB, TROPONINI, MYOGLOBIN in the last 168 hours.  Invalid input(s): CK ------------------------------------------------------------------------------------------------------------------ Invalid input(s): POCBNP   ---------------------------------------------------------------------------------------------------------------  Urinalysis    Component Value Date/Time   COLORURINE YELLOW (A) 12/22/2016 1454   APPEARANCEUR CLOUDY (A) 12/22/2016 1454   APPEARANCEUR Clear 08/09/2012 2333   LABSPEC 1.018 12/22/2016 1454   LABSPEC 1.006 08/09/2012 2333   PHURINE 5.0 12/22/2016 1454   GLUCOSEU NEGATIVE 12/22/2016 1454   GLUCOSEU Negative 08/09/2012 2333   HGBUR NEGATIVE 12/22/2016 1454   BILIRUBINUR NEGATIVE  12/22/2016 1454   BILIRUBINUR Negative 08/09/2012 2333   KETONESUR NEGATIVE 12/22/2016 1454   PROTEINUR NEGATIVE 12/22/2016 1454   NITRITE NEGATIVE 12/22/2016 1454   LEUKOCYTESUR NEGATIVE 12/22/2016 1454   LEUKOCYTESUR Negative 08/09/2012 2333     Imaging results:   Dg Knee 1-2 Views Left  Result Date: 01/05/2017 CLINICAL DATA:  83 year old female status post total knee arthroplasty. EXAM: LEFT KNEE - 1-2 VIEW COMPARISON:  Left knee CT 11/30/2016 FINDINGS: Portable AP and cross-table lateral views of the left knee. Total knee arthroplasty hardware appears intact and normally aligned. Anterior skin staples and wound VAC in place. Postoperative gas in the left knee joint. Postoperative changes to the posterior patella. No unexpected osseous finding. IMPRESSION: Left total knee arthroplasty with no adverse features. Electronically Signed   By: Genevie Ann M.D.   On: 01/05/2017 15:26    Assessment & Plan  Active Problems:   Primary localized osteoarthritis of left knee    1.  Essential hypertension: Uncontrolled: It is better after giving the BP medicine this morning.  Patient started on clonidine 0.2 mg p.o. twice daily yesterday, she is already on amlodipine 10 mg daily, furosemide 20 mg daily, restart losartan on a regular basis instead of as needed.  Patient told me that she takes losartan 25 mg daily at home.  Follow along, advised to continue losartan, amlodipine, continue clonidine,, Lasix. 2.  Left knee arthroplasty, physical therapy following the patient, continue incentive spirometry, discharge as per the recommendation, she told me that narcotics are making her dizzy and confused.  Minimize use of narcotics.  DVT Prophylaxis   AM Labs Ordered, also please review Full Orders  Family Communication: Plan discussed with patient   Thank you for the consult, we will follow the patient with you in the Hospital.   Epifanio Lesches M.D on 01/07/2017 at 10:19 AM  Note: This  dictation was prepared with  Dragon dictation along with smaller Company secretary. Any transcriptional errors that result from this process are unintentional.

## 2017-01-08 MED ORDER — LOSARTAN POTASSIUM 25 MG PO TABS: 25.0000 mg | ORAL_TABLET | Freq: Every day | ORAL | 0 refills | Status: AC

## 2017-01-08 MED ORDER — ENOXAPARIN SODIUM 40 MG/0.4ML ~~LOC~~ SOLN: 40.0000 mg | SUBCUTANEOUS | 0 refills | Status: DC

## 2017-01-08 MED ORDER — ACETAMINOPHEN 500 MG PO TABS: 500.0000 mg | ORAL_TABLET | Freq: Four times a day (QID) | ORAL | 0 refills | Status: AC | PRN

## 2017-01-08 MED ORDER — AMLODIPINE BESYLATE 10 MG PO TABS: 10.0000 mg | ORAL_TABLET | Freq: Every day | ORAL | 0 refills | Status: AC

## 2017-01-08 MED ORDER — CLONIDINE HCL 0.1 MG PO TABS: 0.1000 mg | ORAL_TABLET | Freq: Every day | ORAL | 0 refills | Status: DC | PRN

## 2017-01-08 MED ORDER — OXYCODONE HCL 5 MG PO TABS: 2.5000 mg | ORAL_TABLET | ORAL | 0 refills | Status: AC | PRN

## 2017-01-08 NOTE — Progress Notes (Signed)
Physical Therapy Treatment Patient Details Name: Vicki Raymond MRN: [id number] DOB: 02-17-1934 Today's Date: 01/08/2017    History of Present Illness Pt was admitted s/p L TKA.  PMH includes peripheral neuropathy, CVA, colon CA, gouty arthritis and a heart murmur.  Pt stated that she also has a hx of a Baker's cyst.    PT Comments    Pt demonstrates slight improvement in mobility from previous sessions. She is able to ambulate to bathroom and back to recliner with therapist. She ambulates with short steps and heavy UE support. Decreased weight shifting to LLE during gait. Assist required to steer walker during ambulation. +2 present for additional safety but no external assist required. Cues for proper sequencing with rolling walker. She is able to complete all exercises as instructed with slowly improving L knee flexion noted. She is unsafe to return home and will need SNF placement at discharge. Pt will benefit from PT services to address deficits in strength, balance, and mobility in order to return to full function at home.   Follow Up Recommendations  SNF     Equipment Recommendations  None recommended by PT    Recommendations for Other Services       Precautions / Restrictions Precautions Precautions: Knee;Fall Restrictions Weight Bearing Restrictions: Yes LLE Weight Bearing: Weight bearing as tolerated    Mobility  Bed Mobility Overal bed mobility: Needs Assistance Bed Mobility: Supine to Sit;Sit to Supine     Supine to sit: Min assist Sit to supine: Min assist   General bed mobility comments: Pt requiring LE and trunk assist as well as cues for sequencing. UE support on bed rails and HOB elevated. Significant increase in time required to get to EOB  Transfers Overall transfer level: Needs assistance Equipment used: Rolling walker (2 wheeled) Transfers: Sit to/from Stand Sit to Stand: Min assist         General transfer comment: Pt requires cues for safe  hand placement. Assist required to come to standing. Heavy UE use and decreased weight shifting to LLE. Increased time required to perform. Performed toilet transfers as well with extensive cues to perform safely  Ambulation/Gait Ambulation/Gait assistance: Min assist;+2 safety/equipment Ambulation Distance (Feet): 20 Feet Assistive device: Rolling walker (2 wheeled) Gait Pattern/deviations: Step-to pattern Gait velocity: Decreased Gait velocity interpretation: <1.8 ft/sec, indicative of risk for recurrent falls General Gait Details: Pt ambulates with short steps and heavy UE support. Decreased weight shifting to LLE during gait. Assist required to steer walker during ambulation. Pt able to ambulate with therapist from bed to commode and back. +2 present for additional safety but no external assist required. Cues for proper sequencing with rolling walker.   Stairs            Wheelchair Mobility    Modified Rankin (Stroke Patients Only)       Balance Overall balance assessment: Needs assistance Sitting-balance support: No upper extremity supported Sitting balance-Leahy Scale: Fair     Standing balance support: Bilateral upper extremity supported Standing balance-Leahy Scale: Fair                              Cognition Arousal/Alertness: Awake/alert Behavior During Therapy: WFL for tasks assessed/performed Overall Cognitive Status: No family/caregiver present to determine baseline cognitive functioning  Exercises Total Joint Exercises Ankle Circles/Pumps: AROM;Both;20 reps;Supine Quad Sets: Strengthening;Supine;Both;10 reps Gluteal Sets: Strengthening;Both;10 reps;Supine Towel Squeeze: Strengthening;Both;10 reps;Supine Hip ABduction/ADduction: Strengthening;Both;10 reps;Supine Straight Leg Raises: Strengthening;Left;10 reps;Supine Long Arc Quad: Strengthening;Left;10 reps;Seated Knee Flexion:  Strengthening;Left;10 reps;Seated Goniometric ROM: 6-77 AAROM, pain limited    General Comments        Pertinent Vitals/Pain Pain Assessment: 0-10 Pain Score: 5  Pain Location: L knee Pain Descriptors / Indicators: Aching Pain Intervention(s): Monitored during session    Home Living                      Prior Function            PT Goals (current goals can now be found in the care plan section) Acute Rehab PT Goals Patient Stated Goal: To go to SNF and continue rehab. Progress towards PT goals: Progressing toward goals    Frequency    BID      PT Plan Current plan remains appropriate    Co-evaluation              AM-PAC PT "6 Clicks" Daily Activity  Outcome Measure  Difficulty turning over in bed (including adjusting bedclothes, sheets and blankets)?: A Little Difficulty moving from lying on back to sitting on the side of the bed? : Unable Difficulty sitting down on and standing up from a chair with arms (e.g., wheelchair, bedside commode, etc,.)?: Unable Help needed moving to and from a bed to chair (including a wheelchair)?: A Little Help needed walking in hospital room?: A Little Help needed climbing 3-5 steps with a railing? : A Lot 6 Click Score: 13    End of Session Equipment Utilized During Treatment: Gait belt Activity Tolerance: Patient tolerated treatment well Patient left: in chair;with call bell/phone within reach;with chair alarm set;with SCD's reapplied;Other (comment)(polar care and towel roll under heel)   PT Visit Diagnosis: Unsteadiness on feet (R26.81);Other abnormalities of gait and mobility (R26.89);Muscle weakness (generalized) (M62.81);Pain Pain - Right/Left: Left Pain - part of body: Knee     Time: 0910-0940 PT Time Calculation (min) (ACUTE ONLY): 30 min  Charges:  $Gait Training: 8-22 mins $Therapeutic Exercise: 8-22 mins                    G Codes:       Lyndel Safe Huprich PT, DPT   Huprich,Jason 01/08/2017,  10:40 AM

## 2017-01-08 NOTE — Progress Notes (Signed)
Patient is medically stable for D/C to [facility] today. Per [facility] admissions coordinator at [facility] patient can come today to room 352. RN will call report at [phone] and arrange EMS for transport. Clinical Education officer, museum (CSW) sent D/C orders to [employer] via [employer]. Patient is aware of above. Patient's son Mikeal Hawthorne is at bedside and aware of above. Please reconsult if future social work needs arise. CSW signing off.   [employer], LCSW [phone]

## 2017-01-08 NOTE — Progress Notes (Signed)
   Subjective: 3 Days Post-Op Procedure(s) (LRB): TOTAL KNEE ARTHROPLASTY (Left) Patient reports pain as moderate.   Patient is well, and has had no acute complaints or problems Denies any CP, SOB, ABD pain. We will continue therapy today.  Blood pressure much improved Having hallucinations after tramadol but this improved.   Objective: Vital signs in last 24 hours: Temp:  [97.6 F (36.4 C)-98.8 F (37.1 C)] 98.8 F (37.1 C) (11/16 0747) Pulse Rate:  [59-80] 59 (11/16 0747) Resp:  [16-19] 16 (11/16 0747) BP: (127-141)/(51-54) 127/52 (11/16 0747) SpO2:  [92 %-98 %] 94 % (11/16 0747)  Intake/Output from previous day: 11/15 0701 - 11/16 0700 In: -  Out: 125 [Urine:125] Intake/Output this shift: No intake/output data recorded.  Recent Labs    01/05/17 1739 01/06/17 0533 01/07/17 0356  HGB 12.8 12.1 11.0*   Recent Labs    01/06/17 0533 01/07/17 0356  WBC 7.1 9.9  RBC 3.63* 3.29*  HCT 36.8 33.0*  PLT 167 161   Recent Labs    01/06/17 0533 01/07/17 0356  NA 135 131*  K 3.9 3.5  CL 105 102  CO2 25 23  BUN 15 12  CREATININE 0.69 0.50  GLUCOSE 118* 132*  CALCIUM 8.7* 8.8*   No results for input(s): LABPT, INR in the last 72 hours.  EXAM General - Patient is Alert, Appropriate and Oriented Extremity - Neurovascular intact Sensation intact distally Intact pulses distally Dorsiflexion/Plantar flexion intact No cellulitis present Compartment soft Dressing - dressing C/D/I and no drainage, hospital wound VAC system disconnected, Praveena connected. Motor Function - intact, moving foot and toes well on exam.   Past Medical History:  Diagnosis Date  . Anemia   . Anginal pain ([place])   . Anxiety   . Cancer [facility]    uterine, endometrial, cervix  . Complication of anesthesia    slow CNS response and decreased respirations; had to be intubated 1980' s from cholecystectomy  . Diverticulosis   . GERD (gastroesophageal reflux disease)   . Heart murmur   .  Hypertension   . Peripheral neuropathy   . Pneumonia   . PONV (postoperative nausea and vomiting)   . Presence of permanent cardiac pacemaker   . Shortness of breath dyspnea     Assessment/Plan:   3 Days Post-Op Procedure(s) (LRB): TOTAL KNEE ARTHROPLASTY (Left) Active Problems:   Primary localized osteoarthritis of left knee  Estimated body mass index is 27.26 kg/m as calculated from the following:   Height as of 12/22/16: 5\' 10"  (1.778 m).   Weight as of this encounter: 86.2 kg (190 lb). Advance diet Up with therapy  Discharge to skilled nursing facility today.  Follow-up with  [facility] in 2 weeks. Remove wound VAC and apply honeycomb dressing on 01/15/2017. TED hose to bilateral lower extremities during the day remove at nighttime.  DVT Prophylaxis - Lovenox, Foot Pumps and TED hose Weight-Bearing as tolerated to left leg   Dorise Hiss, PA-C [place] 01/08/2017, 8:15 AM

## 2017-01-08 NOTE — Clinical Social Work Placement (Signed)
   CLINICAL SOCIAL WORK PLACEMENT  NOTE  Date:  01/08/2017  Patient Details  Name: Vicki Raymond MRN: [id number] Date of Birth: [birth year]  Clinical Social Work is seeking post-discharge placement for this patient at the [place] level of care (*CSW will initial, date and re-position this form in  chart as items are completed):  Yes   Patient/family provided with [place] Work Department's list of facilities offering this level of care within the geographic area requested by the patient (or if unable, by the patient's family).  Yes   Patient/family informed of their freedom to choose among providers that offer the needed level of care, that participate in Medicare, Medicaid or managed care program needed by the patient, have an available bed and are willing to accept the patient.  Yes   Patient/family informed of [place]'s ownership interest in [facility] and [facility], as well as of the fact that they are under no obligation to receive care at these facilities.  PASRR submitted to EDS on       PASRR number received on       Existing PASRR number confirmed on 01/06/17     FL2 transmitted to all facilities in geographic area requested by pt/family on 01/06/17     FL2 transmitted to all facilities within larger geographic area on       Patient informed that his/her managed care company has contracts with or will negotiate with certain facilities, including the following:        Yes   Patient/family informed of bed offers received.  Patient chooses bed at [facility] )     Physician recommends and patient chooses bed at      Patient to be transferred to [facility] ) on 01/08/17.  Patient to be transferred to facility by [facility] )     Patient family notified on 01/08/17 of transfer.  Name of family member notified:  (Patient's son Vicki Raymond is at bedside and aware of D/C today. )     PHYSICIAN        Additional Comment:    _______________________________________________ [name], Veronia Beets, LCSW 01/08/2017, 11:46 AM

## 2017-01-08 NOTE — Progress Notes (Signed)
Being discharged to skilled nursing by orthopedic.  Presents better control, today morning 127/52, heart rate is around 60.  Continue amlodipine 10 mg daily, Lasix 20 mg p.o. daily, losartan 25 mg p.o. daily, patient started on clonidine 0.2 mg twice daily yesterday, I recommend decreasing the dose of clonidine to 0.1 mg as needed for SBP more than 160/90. Discussed with orthopedic. PA Dorise Hiss. Time spent;10 min

## 2017-01-08 NOTE — Progress Notes (Signed)
Report called to [facility]. Spoke with Cecille Rubin. Prescriptions in transfer package along with AVS. EMS here for transportation. Vital signs stable at this time. IV was removed. All patient belongings packed.

## 2017-01-08 NOTE — Discharge Summary (Signed)
Physician Discharge Summary  Patient ID: Vicki Raymond MRN: [id number] DOB/AGE: 1933/06/05 83 y.o.  Admit date: 01/05/2017 Discharge date: 01/08/2017  Admission Diagnoses:  PRIMARY LOCALIZED OSTEOARTHRITIS OF LEFT KNEE   Discharge Diagnoses: Patient Active Problem List   Diagnosis Date Noted  . Primary localized osteoarthritis of left knee 01/05/2017  . Sinus bradycardia 07/05/2014    Past Medical History:  Diagnosis Date  . Anemia   . Anginal pain ([place])   . Anxiety   . Cancer [employer])    uterine, endometrial, cervix  . Complication of anesthesia    slow CNS response and decreased respirations; had to be intubated 1980' s from cholecystectomy  . Diverticulosis   . GERD (gastroesophageal reflux disease)   . Heart murmur   . Hypertension   . Peripheral neuropathy   . Pneumonia   . PONV (postoperative nausea and vomiting)   . Presence of permanent cardiac pacemaker   . Shortness of breath dyspnea      Transfusion: none   Consultants (if any): Treatment Team:  Max Sane, MD Epifanio Lesches, MD  Discharged Condition: Improved  Hospital Course: Vicki Raymond is an 83 y.o. female who was admitted 01/05/2017 with a diagnosis of <principal problem not specified> and went to the operating room on 01/05/2017 and underwent the above named procedures.    Surgeries: Procedure(s): TOTAL KNEE ARTHROPLASTY on 01/05/2017 Patient tolerated the surgery well. Taken to PACU where she was stabilized and then transferred to the orthopedic floor.  Started on Lovenox 30 q 12 hrs. Foot pumps applied bilaterally at 80 mm. Heels elevated on bed with rolled towels. No evidence of DVT. Negative [place]. Physical therapy started on day #1 for gait training and transfer. OT started day #1 for ADL and assisted devices.  Patient's foley was d/c on day #1. Patient's IV was d/c on day #2.  On postop day 2 medicine order was consulted for hypertension.  Patient with systolic blood  pressure greater than 190.  Medicine recommended making amlodipine and losartan a daily medication and adding clonidine 0.1 mg daily as needed systolic blood pressure greater than 160/90.  On post op day #3 patient was vital signs were within normal limits, blood pressure much improved, patient stable and ready for discharge to skilled nursing facility  .Remove wound VAC and apply honeycomb dressing on 01/15/2017. TED hose to bilateral lower extremities during the day remove at nighttime.  Implants: Medacta GMK left 4 femur, 4 tibia with short stem and 10 mm PS insert, size 2 patella all components cemented    She was given perioperative antibiotics:  Anti-infectives (From admission, onward)   Start     Dose/Rate Route Frequency Ordered Stop   01/05/17 1800  clindamycin (CLEOCIN) IVPB 900 mg    Comments:  In normal saline   900 mg 100 mL/hr over 30 Minutes Intravenous Every 6 hours 01/05/17 1700 01/06/17 0900   01/05/17 1130  clindamycin (CLEOCIN) 900 mg in sodium chloride 0.9 % 50 mL injection     100 mL/hr over 30 Minutes Intravenous  Once 01/05/17 1118     01/05/17 1100  clindamycin (CLEOCIN) IVPB 900 mg  Status:  Discontinued     900 mg 100 mL/hr over 30 Minutes Intravenous  Once 01/05/17 1052 01/05/17 1118    .  She was given sequential compression devices, early ambulation, and Lovenox for DVT prophylaxis.  She benefited maximally from the hospital stay and there were no complications.    Recent vital signs:  Vitals:   01/08/17 0648 01/08/17 0747  BP:  (!) 127/52  Pulse:  (!) 59  Resp:  16  Temp:  98.8 F (37.1 C)  SpO2: 92% 94%    Recent laboratory studies:  Lab Results  Component Value Date   HGB 11.0 (L) 01/07/2017   HGB 12.1 01/06/2017   HGB 12.8 01/05/2017   Lab Results  Component Value Date   WBC 9.9 01/07/2017   PLT 161 01/07/2017   Lab Results  Component Value Date   INR 1.07 12/22/2016   Lab Results  Component Value Date   NA 131 (L)  01/07/2017   K 3.5 01/07/2017   CL 102 01/07/2017   CO2 23 01/07/2017   BUN 12 01/07/2017   CREATININE 0.50 01/07/2017   GLUCOSE 132 (H) 01/07/2017    Discharge Medications:   Allergies as of 01/08/2017      Reactions   Corn-containing Products Anaphylaxis   Ace Inhibitors Cough   Angiotensin Receptor Blockers Cough   Penicillins Hives   Has patient had a PCN reaction causing immediate rash, facial/tongue/throat swelling, SOB or lightheadedness with hypotension: Yes Has patient had a PCN reaction causing severe rash involving mucus membranes or skin necrosis: No Has patient had a PCN reaction that required hospitalization: No Has patient had a PCN reaction occurring within the last 10 years: No If all of the above answers are "NO", then may proceed with Cephalosporin use.   Cefazolin Rash   Keflex [cephalexin] Itching, Rash      Medication List    STOP taking these medications   HYDROcodone-acetaminophen 5-325 MG tablet Commonly known as:  NORCO/VICODIN     TAKE these medications   acetaminophen 500 MG tablet Commonly known as:  TYLENOL Take 1-2 tablets (500-1,000 mg total) every 6 (six) hours as needed by mouth for moderate pain. What changed:    medication strength  how much to take  when to take this   allopurinol 300 MG tablet Commonly known as:  ZYLOPRIM Take 300 mg by mouth daily.   amLODipine 10 MG tablet Commonly known as:  NORVASC Take 1 tablet (10 mg total) daily by mouth. What changed:    medication strength  how much to take   aspirin EC 81 MG tablet Take 81 mg by mouth daily.   CALCIUM-MAGNESIUM-ZINC PO Take 1 tablet by mouth daily.   cloNIDine 0.1 MG tablet Commonly known as:  CATAPRES Take 1 tablet (0.1 mg total) daily as needed by mouth (Systolic blood pressure greater than 160/90).   Cyanocobalamin 1000 MCG/ML Liqd Take 1 mL by mouth every other day.   docusate sodium 100 MG capsule Commonly known as:  COLACE Take 100 mg by  mouth 2 (two) times daily.   enoxaparin 40 MG/0.4ML injection Commonly known as:  LOVENOX Inject 0.4 mLs (40 mg total) daily for 14 days into the skin.   furosemide 20 MG tablet Commonly known as:  LASIX Take 20 mg by mouth daily.   gabapentin 100 MG capsule Commonly known as:  NEURONTIN Take 100 mg by mouth 3 (three) times daily.   GLUCOSAMINE-CHONDROITIN PO Take 1 tablet by mouth daily.   losartan 25 MG tablet Commonly known as:  COZAAR Take 1 tablet (25 mg total) daily by mouth. What changed:    when to take this  reasons to take this   multivitamin tablet Take 1 tablet by mouth daily.   MYRBETRIQ 50 MG Tb24 tablet Generic drug:  mirabegron ER Take 50 mg  by mouth daily.   omeprazole 20 MG capsule Commonly known as:  PRILOSEC Take 20 mg by mouth daily.   oxyCODONE 5 MG immediate release tablet Commonly known as:  Oxy IR/ROXICODONE Take 0.5-1 tablets (2.5-5 mg total) every 4 (four) hours as needed by mouth for moderate pain ((score 4 to 6)).   sertraline 50 MG tablet Commonly known as:  ZOLOFT Take 50 mg by mouth daily.   Vitamin D3 2000 units Tabs Take 2,000 Units by mouth daily.            Durable Medical Equipment  (From admission, onward)        Start     Ordered   01/05/17 1701  DME Walker rolling  Once    Question:  Patient needs a walker to treat with the following condition  Answer:  Status post total knee replacement using cement, left   01/05/17 1700   01/05/17 1701  DME 3 n 1  Once     01/05/17 1700   01/05/17 1701  DME Bedside commode  Once    Question:  Patient needs a bedside commode to treat with the following condition  Answer:  Status post total knee replacement using cement, left   01/05/17 1700      Diagnostic Studies: Dg Knee 1-2 Views Left  Result Date: 01/05/2017 CLINICAL DATA:  83 year old female status post total knee arthroplasty. EXAM: LEFT KNEE - 1-2 VIEW COMPARISON:  Left knee CT 11/30/2016 FINDINGS: Portable AP and  cross-table lateral views of the left knee. Total knee arthroplasty hardware appears intact and normally aligned. Anterior skin staples and wound VAC in place. Postoperative gas in the left knee joint. Postoperative changes to the posterior patella. No unexpected osseous finding. IMPRESSION: Left total knee arthroplasty with no adverse features. Electronically Signed   By: Genevie Ann M.D.   On: 01/05/2017 15:26   Ct Head Wo Contrast  Result Date: 12/21/2016 CLINICAL DATA:  Tripped and fell hitting LEFT side of face. No reported LOC. EXAM: CT HEAD WITHOUT CONTRAST CT MAXILLOFACIAL WITHOUT CONTRAST TECHNIQUE: Multidetector CT imaging of the head and maxillofacial structures were performed using the standard protocol without intravenous contrast. Multiplanar CT image reconstructions of the maxillofacial structures were also generated. COMPARISON:  CT head 10/19/2013. FINDINGS: CT HEAD FINDINGS Brain: No evidence for acute infarction, hemorrhage, mass lesion, hydrocephalus, or extra-axial fluid. Normal for age cerebral volume. Slight hypoattenuation of white matter, likely chronic microvascular ischemic change. Vascular: Calcification of the cavernous internal carotid arteries consistent with cerebrovascular atherosclerotic disease. No signs of intracranial large vessel occlusion. Skull: No skull fracture. Other: None. CT MAXILLOFACIAL FINDINGS Osseous: No fracture or mandibular dislocation. No destructive process. Palatine tori. Orbits: Negative. No traumatic or inflammatory finding. BILATERAL cataract extraction. Sinuses: Chronic mucosal thickening. No layering fluid. No blowout injury. Soft tissues: LEFT facial soft tissue swelling.  No foreign body. IMPRESSION: No skull fracture or intracranial hemorrhage. No facial fracture or mandible dislocation. LEFT facial soft tissue swelling. Electronically Signed   By: Staci Righter M.D.   On: 12/21/2016 11:09   Ct Cervical Spine Wo Contrast  Result Date:  12/21/2016 CLINICAL DATA:  Pain following fall EXAM: CT CERVICAL SPINE WITHOUT CONTRAST TECHNIQUE: Multidetector CT imaging of the cervical spine was performed without intravenous contrast. Multiplanar CT image reconstructions were also generated. COMPARISON:  None. FINDINGS: Alignment: No evident spondylolisthesis. Skull base and vertebrae: Skull base and craniocervical junction regions appear normal. Cystic changes are noted in the odontoid. Bones appear osteoporotic. No evident  fracture. There are no blastic or lytic bone lesions. Soft tissues and spinal canal: Prevertebral soft tissues and predental space regions are normal. There is no paraspinous lesion. There is no evident cord or canal hematoma. Disc levels: There is mild disc space narrowing at C5-6 and C7-T1. Other disc spaces appear normal. There are small anterior osteophytes at C5 and C6. There are foci of calcification in the anterior ligament at C5-6 and C6-7. There is facet osteoarthritic change at multiple levels bilaterally. There is severe facet osteoarthritic change on the right at C4-5 with impression on the exiting nerve root on the right at C4-5 due to bony hypertrophy. A a slightly less degree of facet osteoarthritic change impressing on the exiting nerve root is noted at C5-6 on the right. There is severe facet osteoarthritic change on the left at C5-6 without appreciable exit foraminal narrowing. There is mild exit foraminal narrowing at C6-7 on the left with severe bony hypertrophy in this area. There is no frank disc extrusion or stenosis. Upper chest: Visualized upper lung zones are clear except for a calcified granuloma 1 in the posterior segment of the right upper lobe. Other: There is calcification in the right carotid artery as well as in both carotid arteries. There are nodular opacities in the thyroid consistent with multinodular goiter. Largest of these nodular opacities is on the right measuring 8 mm. IMPRESSION: 1.  No fracture  or spondylolisthesis. 2. Multilevel osteoarthritic change. No frank disc extrusion or stenosis. Areas of exit foraminal narrowing as noted above. 3. Areas of arterial vascular calcification in both carotid arteries and right subclavian artery. 4.  Multinodular goiter without dominant mass. Electronically Signed   By: Lowella Grip III M.D.   On: 12/21/2016 11:38   Ct Maxillofacial Wo Cm  Result Date: 12/21/2016 CLINICAL DATA:  Tripped and fell hitting LEFT side of face. No reported LOC. EXAM: CT HEAD WITHOUT CONTRAST CT MAXILLOFACIAL WITHOUT CONTRAST TECHNIQUE: Multidetector CT imaging of the head and maxillofacial structures were performed using the standard protocol without intravenous contrast. Multiplanar CT image reconstructions of the maxillofacial structures were also generated. COMPARISON:  CT head 10/19/2013. FINDINGS: CT HEAD FINDINGS Brain: No evidence for acute infarction, hemorrhage, mass lesion, hydrocephalus, or extra-axial fluid. Normal for age cerebral volume. Slight hypoattenuation of white matter, likely chronic microvascular ischemic change. Vascular: Calcification of the cavernous internal carotid arteries consistent with cerebrovascular atherosclerotic disease. No signs of intracranial large vessel occlusion. Skull: No skull fracture. Other: None. CT MAXILLOFACIAL FINDINGS Osseous: No fracture or mandibular dislocation. No destructive process. Palatine tori. Orbits: Negative. No traumatic or inflammatory finding. BILATERAL cataract extraction. Sinuses: Chronic mucosal thickening. No layering fluid. No blowout injury. Soft tissues: LEFT facial soft tissue swelling.  No foreign body. IMPRESSION: No skull fracture or intracranial hemorrhage. No facial fracture or mandible dislocation. LEFT facial soft tissue swelling. Electronically Signed   By: Staci Righter M.D.   On: 12/21/2016 11:09    Disposition: 01-Home or Self Care     Contact information for follow-up providers    Hessie Knows, MD Follow up on 01/20/2017.   Specialty:  Orthopedic Surgery Why:  at [address] Contact information: [place] [address] [phone]            Contact information for after-discharge care    Destination    [facility] .   Service:  Skilled Chiropodist information: [address] [place] [phone]  Signed: Dorise Hiss CHRISTOPHER 01/08/2017, 8:25 AM

## 2017-01-12 ENCOUNTER — Other Ambulatory Visit: Payer: Self-pay

## 2017-01-12 MED ORDER — TRAMADOL HCL 50 MG PO TABS: 50.0000 mg | ORAL_TABLET | ORAL | 0 refills | Status: AC | PRN

## 2017-01-12 NOTE — Telephone Encounter (Signed)
Rx sent to [facility] phone : [phone] , fax : [phone]  

## 2017-01-13 ENCOUNTER — Other Ambulatory Visit
Admission: RE | Admit: 2017-01-13 | Discharge: 2017-01-13 | Disposition: A | Discharge status: Home / Self Care | Payer: Medicare Other | Source: Ambulatory Visit | Attending: Gerontology | Admitting: Gerontology

## 2017-01-13 DIAGNOSIS — Z471 Aftercare following joint replacement surgery: Secondary | ICD-10-CM | POA: Diagnosis present

## 2017-01-13 LAB — CBC WITH DIFFERENTIAL/PLATELET
BASOS ABS: 0.1 10*3/uL (ref 0–0.1)
BASOS PCT: 1 %
EOS ABS: 0.3 10*3/uL (ref 0–0.7)
EOS PCT: 3 %
HCT: 29.5 % — ABNORMAL LOW (ref 35.0–47.0)
Hemoglobin: 9.9 g/dL — ABNORMAL LOW (ref 12.0–16.0)
Lymphocytes Relative: 13 %
Lymphs Abs: 1.4 10*3/uL (ref 1.0–3.6)
MCH: 33.3 pg (ref 26.0–34.0)
MCHC: 33.4 g/dL (ref 32.0–36.0)
MCV: 99.6 fL (ref 80.0–100.0)
Monocytes Absolute: 0.9 10*3/uL (ref 0.2–0.9)
Monocytes Relative: 9 %
Neutro Abs: 8 10*3/uL — ABNORMAL HIGH (ref 1.4–6.5)
Neutrophils Relative %: 74 %
PLATELETS: 273 10*3/uL (ref 150–440)
RBC: 2.96 MIL/uL — AB (ref 3.80–5.20)
RDW: 15.2 % — ABNORMAL HIGH (ref 11.5–14.5)
WBC: 10.8 10*3/uL (ref 3.6–11.0)

## 2017-01-13 LAB — COMPREHENSIVE METABOLIC PANEL
ALBUMIN: 2.7 g/dL — AB (ref 3.5–5.0)
ALT: 48 U/L (ref 14–54)
AST: 31 U/L (ref 15–41)
Alkaline Phosphatase: 56 U/L (ref 38–126)
Anion gap: 7 (ref 5–15)
BUN: 13 mg/dL (ref 6–20)
CHLORIDE: 104 mmol/L (ref 101–111)
CO2: 24 mmol/L (ref 22–32)
CREATININE: 0.39 mg/dL — AB (ref 0.44–1.00)
Calcium: 8.8 mg/dL — ABNORMAL LOW (ref 8.9–10.3)
GFR calc non Af Amer: >60 mL/min (ref >60)
Glucose, Bld: 90 mg/dL (ref 65–99)
Potassium: 3.5 mmol/L (ref 3.5–5.1)
SODIUM: 135 mmol/L (ref 135–145)
Total Bilirubin: 0.8 mg/dL (ref 0.3–1.2)
Total Protein: 5.9 g/dL — ABNORMAL LOW (ref 6.5–8.1)

## 2017-01-19 ENCOUNTER — Other Ambulatory Visit
Admission: RE | Admit: 2017-01-19 | Discharge: 2017-01-19 | Disposition: A | Discharge status: Home / Self Care | Payer: Medicare Other | Source: Other Acute Inpatient Hospital | Attending: Gerontology | Admitting: Gerontology

## 2017-01-19 DIAGNOSIS — Z471 Aftercare following joint replacement surgery: Secondary | ICD-10-CM | POA: Insufficient documentation

## 2017-01-19 LAB — COMPREHENSIVE METABOLIC PANEL
ALBUMIN: 3.3 g/dL — AB (ref 3.5–5.0)
ALK PHOS: 55 U/L (ref 38–126)
ALT: 28 U/L (ref 14–54)
AST: 23 U/L (ref 15–41)
Anion gap: 8 (ref 5–15)
BILIRUBIN TOTAL: 0.6 mg/dL (ref 0.3–1.2)
BUN: 12 mg/dL (ref 6–20)
CALCIUM: 9.3 mg/dL (ref 8.9–10.3)
CO2: 23 mmol/L (ref 22–32)
Chloride: 103 mmol/L (ref 101–111)
Creatinine, Ser: 0.55 mg/dL (ref 0.44–1.00)
GFR calc Af Amer: >60 mL/min (ref >60)
GLUCOSE: 92 mg/dL (ref 65–99)
POTASSIUM: 3.9 mmol/L (ref 3.5–5.1)
Sodium: 134 mmol/L — ABNORMAL LOW (ref 135–145)
TOTAL PROTEIN: 6.4 g/dL — AB (ref 6.5–8.1)

## 2017-01-19 LAB — CBC
HEMATOCRIT: 32.4 % — AB (ref 35.0–47.0)
Hemoglobin: 10.8 g/dL — ABNORMAL LOW (ref 12.0–16.0)
MCH: 33.1 pg (ref 26.0–34.0)
MCHC: 33.3 g/dL (ref 32.0–36.0)
MCV: 99.2 fL (ref 80.0–100.0)
PLATELETS: 432 10*3/uL (ref 150–440)
RBC: 3.27 MIL/uL — ABNORMAL LOW (ref 3.80–5.20)
RDW: 15.3 % — AB (ref 11.5–14.5)
WBC: 9.7 10*3/uL (ref 3.6–11.0)

## 2017-01-21 ENCOUNTER — Non-Acute Institutional Stay (SKILLED_NURSING_FACILITY): Payer: Medicare Other | Admitting: Gerontology

## 2017-01-21 DIAGNOSIS — M1712 Unilateral primary osteoarthritis, left knee: Secondary | ICD-10-CM

## 2017-01-21 DIAGNOSIS — Z96652 Presence of left artificial knee joint: Secondary | ICD-10-CM | POA: Diagnosis not present

## 2017-01-21 DIAGNOSIS — E46 Unspecified protein-calorie malnutrition: Secondary | ICD-10-CM | POA: Diagnosis not present

## 2017-01-22 ENCOUNTER — Encounter: Payer: Self-pay | Admitting: Gerontology

## 2017-01-22 NOTE — Progress Notes (Signed)
Location:   [facility] Room Number: [place]:  [phone])  Provider: Toni Arthurs, NP-C  PCP: Rusty Aus, MD Patient Care Team: Rusty Aus, MD as PCP - General (Internal Medicine)  Extended Emergency Contact Information Primary Emergency Contact: Krus,Samuel L Address: [address]          [place], [place] [address] Johnnette Litter of [place] Phone: [phone] Relation: Son  Code Status: FULL Goals of care:  Advanced Directive information Advanced Directives 01/21/2017  Does Patient Have a Medical Advance Directive? No  Type of Advance Directive -  Does patient want to make changes to medical advance directive? No - Patient declined  Copy of [place] in Chart? -     Allergies  Allergen Reactions  . Corn-Containing Products Anaphylaxis  . Ace Inhibitors Cough  . Angiotensin Receptor Blockers Cough  . Penicillins Hives    Has patient had a PCN reaction causing immediate rash, facial/tongue/throat swelling, SOB or lightheadedness with hypotension: Yes Has patient had a PCN reaction causing severe rash involving mucus membranes or skin necrosis: No Has patient had a PCN reaction that required hospitalization: No Has patient had a PCN reaction occurring within the last 10 years: No If all of the above answers are "NO", then may proceed with Cephalosporin use.   . Cefazolin Rash  . Keflex [Cephalexin] Itching and Rash    Chief Complaint  Patient presents with  . Discharge Note    Discharged from SNF    HPI:  83 y.o. female seen today for discharge evaluation.  Patient was admitted to the facility for rehab status post left total knee arthroplasty related to osteoarthritis.  Patient has been participating in PT/OT.  Patient has been progressing well.  Patient reports pain is well controlled on current regimen.  Patient feels she is ready for discharge.  Patient was also found to have some protein  calorie malnutrition, evidenced by hypoalbuminemia and hypoproteinemia.  Patient was started on supplementation.  She was initially started on Pro-Stat, but was unable to tolerate.  Protein supplement was changed to been a protein packets.  Patient seemed to tolerate this.  Patient was encouraged to continue the been a protein and Ensure and live at home after discharge for nutritional support and wound healing.  Patient verbalized understanding.  Patient reports appetite is fair.  Voiding regularly and having regular BMs.  Vital signs stable.  No other complaints.    Past Medical History:  Diagnosis Date  . Anemia   . Anginal pain ([place])   . Anxiety   . Cancer [facility])    uterine, endometrial, cervix  . Complication of anesthesia    slow CNS response and decreased respirations; had to be intubated 1980' s from cholecystectomy  . Diverticulosis   . GERD (gastroesophageal reflux disease)   . Heart murmur   . Hypertension   . Peripheral neuropathy   . Pneumonia   . PONV (postoperative nausea and vomiting)   . Presence of permanent cardiac pacemaker   . Shortness of breath dyspnea     Past Surgical History:  Procedure Laterality Date  . ABDOMINAL HYSTERECTOMY    . BREAST BIOPSY Left 1980's  . BREAST SURGERY Left   . CATARACT EXTRACTION W/ INTRAOCULAR LENS  IMPLANT, BILATERAL    . CHOLECYSTECTOMY    . COLONOSCOPY WITH PROPOFOL N/A 01/14/2015   Procedure: COLONOSCOPY WITH PROPOFOL;  Surgeon: Manya Silvas, MD;  Location: [place];  Service: Endoscopy;  Laterality: N/A;  . ESOPHAGOGASTRODUODENOSCOPY (EGD) WITH PROPOFOL N/A 01/14/2015   Procedure: ESOPHAGOGASTRODUODENOSCOPY (EGD) WITH PROPOFOL;  Surgeon: Manya Silvas, MD;  Location[facility] ENDOSCOPY;  Service: Endoscopy;  Laterality: N/A;  . EYE SURGERY    . HERNIA REPAIR  2010   with mesh  . INSERT / REPLACE / REMOVE PACEMAKER  03/2014   Dr. Saralyn Pilar  . JOINT REPLACEMENT Left    Left hip replacement  . PACEMAKER LEAD  REMOVAL N/A 07/05/2014   Procedure: Pacemaker insertion ;  Surgeon: Isaias Cowman, MD;  Location: [facility] ORS;  Service: Cardiovascular;  Laterality: N/A;  . TONSILLECTOMY    . TOTAL KNEE ARTHROPLASTY Left 01/05/2017   Procedure: TOTAL KNEE ARTHROPLASTY;  Surgeon: Hessie Knows, MD;  Location: [facility] ORS;  Service: Orthopedics;  Laterality: Left;      reports that  has never smoked. she has never used smokeless tobacco. She reports that she does not drink alcohol or use drugs. Social History   Socioeconomic History  . Marital status: Married    Spouse name: Not on file  . Number of children: Not on file  . Years of education: Not on file  . Highest education level: Not on file  Social Needs  . Financial resource strain: Not on file  . Food insecurity - worry: Not on file  . Food insecurity - inability: Not on file  . Transportation needs - medical: Not on file  . Transportation needs - non-medical: Not on file  Occupational History  . Not on file  Tobacco Use  . Smoking status: Never Smoker  . Smokeless tobacco: Never Used  Substance and Sexual Activity  . Alcohol use: No  . Drug use: No  . Sexual activity: Not on file  Other Topics Concern  . Not on file  Social History Narrative  . Not on file   Functional Status Survey:    Allergies  Allergen Reactions  . Corn-Containing Products Anaphylaxis  . Ace Inhibitors Cough  . Angiotensin Receptor Blockers Cough  . Penicillins Hives    Has patient had a PCN reaction causing immediate rash, facial/tongue/throat swelling, SOB or lightheadedness with hypotension: Yes Has patient had a PCN reaction causing severe rash involving mucus membranes or skin necrosis: No Has patient had a PCN reaction that required hospitalization: No Has patient had a PCN reaction occurring within the last 10 years: No If all of the above answers are "NO", then may proceed with Cephalosporin use.   . Cefazolin Rash  . Keflex [Cephalexin] Itching  and Rash    Pertinent  Health Maintenance Due  Topic Date Due  . DEXA SCAN  12/12/2000  . PNA vac Low Risk Adult (1 of 2 - PCV13) 12/12/2000  . INFLUENZA VACCINE  Completed    Medications: Allergies as of 01/21/2017      Reactions   Corn-containing Products Anaphylaxis   Ace Inhibitors Cough   Angiotensin Receptor Blockers Cough   Penicillins Hives   Has patient had a PCN reaction causing immediate rash, facial/tongue/throat swelling, SOB or lightheadedness with hypotension: Yes Has patient had a PCN reaction causing severe rash involving mucus membranes or skin necrosis: No Has patient had a PCN reaction that required hospitalization: No Has patient had a PCN reaction occurring within the last 10 years: No If all of the above answers are "NO", then may proceed with Cephalosporin use.   Cefazolin Rash   Keflex [cephalexin] Itching, Rash      Medication List  Accurate as of 01/21/17 11:59 PM. Always use your most recent med list.          acetaminophen 500 MG tablet Commonly known as:  TYLENOL Take 1-2 tablets (500-1,000 mg total) every 6 (six) hours as needed by mouth for moderate pain.   allopurinol 300 MG tablet Commonly known as:  ZYLOPRIM Take 300 mg by mouth daily.   amLODipine 10 MG tablet Commonly known as:  NORVASC Take 1 tablet (10 mg total) daily by mouth.   aspirin EC 81 MG tablet Take 81 mg by mouth daily.   BENEPROTEIN Pack Take 1 packet by mouth daily. Mix 1 packet in with beverage or food of choice for protein supplementation   CALCIUM-MAGNESIUM-ZINC PO Take 1 tablet by mouth daily.   CEROVITE SENIOR Tabs Take 1 tablet by mouth daily.   Cyanocobalamin 1000 MCG/ML Liqd Take 1 mL by mouth every other day.   docusate sodium 100 MG capsule Commonly known as:  COLACE Take 100 mg by mouth 2 (two) times daily.   ENSURE ENLIVE PO Take 1 Bottle by mouth 2 (two) times daily between meals.   EPINEPHrine 0.3 mg/0.3 mL Soaj  injection Commonly known as:  EPI-PEN Inject 0.3 mg into the muscle as needed. Inject into outer thigh muscle with any signs of anaphylactic allergic reaction. Then send to the ED for evaluation   furosemide 20 MG tablet Commonly known as:  LASIX Take 20 mg by mouth daily.   gabapentin 100 MG capsule Commonly known as:  NEURONTIN Take 100 mg by mouth 3 (three) times daily.   losartan 25 MG tablet Commonly known as:  COZAAR Take 1 tablet (25 mg total) daily by mouth.   methocarbamol 500 MG tablet Commonly known as:  ROBAXIN Take 500 mg by mouth every 6 (six) hours as needed for muscle spasms.   MYRBETRIQ 50 MG Tb24 tablet Generic drug:  mirabegron ER Take 50 mg by mouth daily.   nystatin 100000 UNIT/ML suspension Commonly known as:  MYCOSTATIN Take 5 mLs by mouth 4 (four) times daily. Swish / gargle and spit   omeprazole 20 MG capsule Commonly known as:  PRILOSEC Take 20 mg by mouth daily.   oxyCODONE 5 MG immediate release tablet Commonly known as:  Oxy IR/ROXICODONE Take 0.5-1 tablets (2.5-5 mg total) every 4 (four) hours as needed by mouth for moderate pain ((score 4 to 6)).   sertraline 50 MG tablet Commonly known as:  ZOLOFT Take 50 mg by mouth daily.   traMADol 50 MG tablet Commonly known as:  ULTRAM Take 1 tablet (50 mg total) by mouth every 4 (four) hours as needed.   Vitamin D3 2000 units Tabs Take 2,000 Units by mouth daily.       Review of Systems  Constitutional: Negative for activity change, appetite change, chills, diaphoresis and fever.  HENT: Negative for congestion, mouth sores, nosebleeds, postnasal drip, sneezing, sore throat, trouble swallowing and voice change.   Respiratory: Negative for apnea, cough, choking, chest tightness, shortness of breath and wheezing.   Cardiovascular: Negative for chest pain, palpitations and leg swelling.  Gastrointestinal: Negative for abdominal distention, abdominal pain, constipation, diarrhea and nausea.   Genitourinary: Negative for difficulty urinating, dysuria, frequency and urgency.  Musculoskeletal: Positive for arthralgias (typical arthritis). Negative for back pain, gait problem and myalgias.  Skin: Positive for wound. Negative for color change, pallor and rash.  Neurological: Negative for dizziness, tremors, syncope, speech difficulty, weakness, numbness and headaches.  Psychiatric/Behavioral: Negative for agitation and  behavioral problems.  All other systems reviewed and are negative.   Vitals:   01/21/17 0837  BP: (!) 150/68  Pulse: 68  Resp: 18  Temp: 98 F (36.7 C)  TempSrc: Oral  SpO2: 95%  Weight: 201 lb 9.6 oz (91.4 kg)  Height: 5\' 10"  (1.778 m)   Body mass index is 28.93 kg/m. Physical Exam  Constitutional: She is oriented to person, place, and time. Vital signs are normal. She appears well-developed and well-nourished. She is active and cooperative. She does not appear ill. No distress.  HENT:  Head: Normocephalic and atraumatic.  Mouth/Throat: Uvula is midline, oropharynx is clear and moist and mucous membranes are normal. Mucous membranes are not pale, not dry and not cyanotic.  Eyes: Conjunctivae, EOM and lids are normal. Pupils are equal, round, and reactive to light.  Neck: Trachea normal, normal range of motion and full passive range of motion without pain. Neck supple. No JVD present. No tracheal deviation, no edema and no erythema present. No thyromegaly present.  Cardiovascular: Normal rate, regular rhythm, normal heart sounds, intact distal pulses and normal pulses. Exam reveals no gallop, no distant heart sounds and no friction rub.  No murmur heard. Pulses:      Dorsalis pedis pulses are 2+ on the right side, and 2+ on the left side.  No edema  Pulmonary/Chest: Effort normal and breath sounds normal. No accessory muscle usage. No respiratory distress. She has no decreased breath sounds. She has no wheezes. She has no rhonchi. She has no rales. She  exhibits no tenderness.  Abdominal: Soft. Normal appearance and bowel sounds are normal. She exhibits no distension and no ascites. There is no tenderness.  Musculoskeletal: Normal range of motion. She exhibits no edema or tenderness.  Expected osteoarthritis, stiffness; Bilateral Calves soft, supple. Negative Homan's Sign. B- pedal pulses equal  Neurological: She is alert and oriented to person, place, and time. She has normal strength.  Skin: Skin is warm and dry. Laceration noted. She is not diaphoretic. No cyanosis. No pallor. Nails show no clubbing.  Psychiatric: She has a normal mood and affect. Her speech is normal and behavior is normal. Judgment and thought content normal. Cognition and memory are normal.  Nursing note and vitals reviewed.   Labs reviewed: Basic Metabolic Panel: Recent Labs    01/07/17 0356 01/13/17 0500 01/19/17 0630  NA 131* 135 134*  K 3.5 3.5 3.9  CL 102 104 103  CO2 23 24 23   GLUCOSE 132* 90 92  BUN 12 13 12   CREATININE 0.50 0.39* 0.55  CALCIUM 8.8* 8.8* 9.3   Liver Function Tests: Recent Labs    01/13/17 0500 01/19/17 0630  AST 31 23  ALT 48 28  ALKPHOS 56 55  BILITOT 0.8 0.6  PROT 5.9* 6.4*  ALBUMIN 2.7* 3.3*   No results for input(s): LIPASE, AMYLASE in the last 8760 hours. No results for input(s): AMMONIA in the last 8760 hours. CBC: Recent Labs    01/07/17 0356 01/13/17 0500 01/19/17 0630  WBC 9.9 10.8 9.7  NEUTROABS  --  8.0*  --   HGB 11.0* 9.9* 10.8*  HCT 33.0* 29.5* 32.4*  MCV 100.3* 99.6 99.2  PLT 161 273 432   Cardiac Enzymes: No results for input(s): CKTOTAL, CKMB, CKMBINDEX, TROPONINI in the last 8760 hours. BNP: Invalid input(s): POCBNP CBG: No results for input(s): GLUCAP in the last 8760 hours.  Procedures and Imaging Studies During Stay: Dg Knee 1-2 Views Left  Result Date: 01/05/2017 CLINICAL  DATA:  83 year old female status post total knee arthroplasty. EXAM: LEFT KNEE - 1-2 VIEW COMPARISON:  Left knee  CT 11/30/2016 FINDINGS: Portable AP and cross-table lateral views of the left knee. Total knee arthroplasty hardware appears intact and normally aligned. Anterior skin staples and wound VAC in place. Postoperative gas in the left knee joint. Postoperative changes to the posterior patella. No unexpected osseous finding. IMPRESSION: Left total knee arthroplasty with no adverse features. Electronically Signed   By: Genevie Ann M.D.   On: 01/05/2017 15:26    Assessment/Plan:   1.  Primary localized osteoarthritis of left knee 2.  S/P total knee arthroplasty, left  Continue PT/OT  Continue exercises as taught by PT/OT  Wound/incision/skin care per protocol  Ice/Polar Care to the knee as needed swelling or pain  Last dose of Lovenox 40 mg subcu every 24 hours scheduled to be given today for DVT prophylaxis  Continue tramadol 50 mg 1 tablet p.o. every 4 hours as needed pain  Follow-up with orthopedist as instructed for continuity of care  2.  Protein calorie malnutrition, unspecified severity  Beneprotein 6 g-25 kcal/7 g 1 packet po twice daily  Ensure Enlive 1 bottle p.o. twice daily  Continue Cerovite Senior multivitamin p.o. daily  Follow-up with PCP ASAP after discharge for continuity of care and medication management   Patient is being discharged with the following home health services: Outpatient PT through [facility]  Patient is being discharged with the following durable medical equipment: None  Patient has been advised to f/u with their PCP in 1-2 weeks to bring them up to date on their rehab stay.  Social services at facility was responsible for arranging this appointment.  Pt was provided with a 30 day supply of prescriptions for medications and refills must be obtained from their PCP.  For controlled substances, a more limited supply may be provided adequate until PCP appointment only.  Future labs/tests needed:    Family/ staff Communication:   Total  Time:  Documentation:  Face to Face:  Family/Phone:  Vikki Ports, NP-C [facility] [address], [place] [address] Cell Phone (Mon-Fri 8am-5pm):  [phone] On Call:  [phone] & follow prompts after 5pm & weekends Office Phone:  [phone] Office Fax:  [phone]

## 2017-02-08 DIAGNOSIS — Z96652 Presence of left artificial knee joint: Secondary | ICD-10-CM | POA: Insufficient documentation

## 2017-02-08 DIAGNOSIS — E46 Unspecified protein-calorie malnutrition: Secondary | ICD-10-CM | POA: Insufficient documentation

## 2018-07-04 ENCOUNTER — Other Ambulatory Visit: Payer: Self-pay | Admitting: Internal Medicine

## 2018-09-17 ENCOUNTER — Other Ambulatory Visit: Payer: Self-pay

## 2018-09-17 ENCOUNTER — Emergency Department
Admission: EM | Admit: 2018-09-17 | Discharge: 2018-09-17 | Disposition: A | Discharge status: Home / Self Care | Payer: Medicare Other | Attending: Emergency Medicine | Admitting: Emergency Medicine

## 2018-09-17 DIAGNOSIS — Z79899 Other long term (current) drug therapy: Secondary | ICD-10-CM | POA: Insufficient documentation

## 2018-09-17 DIAGNOSIS — T7840XA Allergy, unspecified, initial encounter: Secondary | ICD-10-CM

## 2018-09-17 DIAGNOSIS — I10 Essential (primary) hypertension: Secondary | ICD-10-CM | POA: Insufficient documentation

## 2018-09-17 DIAGNOSIS — Z7982 Long term (current) use of aspirin: Secondary | ICD-10-CM | POA: Diagnosis not present

## 2018-09-17 DIAGNOSIS — L299 Pruritus, unspecified: Secondary | ICD-10-CM | POA: Diagnosis present

## 2018-09-17 MED ORDER — METHYLPREDNISOLONE SODIUM SUCC 125 MG IJ SOLR
60.0000 mg | Freq: Once | INTRAMUSCULAR | Status: AC
  Administered 2018-09-17: 60 mg via INTRAVENOUS
  Filled 2018-09-17: qty 2

## 2018-09-17 NOTE — ED Triage Notes (Signed)
Pt presents via EMS c/o allergic reaction. Has taken PO Benadryl and epipen PTA. Denies SOB currently.

## 2018-09-17 NOTE — ED Provider Notes (Signed)
[facility] Emergency Department Provider Note ____________________________________________   First MD Initiated Contact with Patient 09/17/18 1546     (approximate)  I have reviewed the triage vital signs and the nursing notes.   HISTORY  Chief Complaint Allergic Reaction    HPI ISATOU AGREDANO is a 85 y.o. female with PMH as noted below who presents with an apparent allergic reaction, acute onset around 1 PM today, and characterized by diffuse itching, tingling, and red rash to the skin with hives.  The patient states that she felt mild shortness of breath but did not feel like her throat was closing and has no oral swelling.  She initially took some expired Benadryl, but then her son gave her fresh Benadryl and she used her EpiPen around 2 PM.  She states she is feeling much better now and the symptoms have almost completely resolved.  She is not sure what she had the reaction to.  Past Medical History:  Diagnosis Date  . Anemia   . Anginal pain ([place])   . Anxiety   . Cancer [facility])    uterine, endometrial, cervix  . Complication of anesthesia    slow CNS response and decreased respirations; had to be intubated 1980' s from cholecystectomy  . Diverticulosis   . GERD (gastroesophageal reflux disease)   . Heart murmur   . Hypertension   . Peripheral neuropathy   . Pneumonia   . PONV (postoperative nausea and vomiting)   . Presence of permanent cardiac pacemaker   . Shortness of breath dyspnea     Patient Active Problem List   Diagnosis Date Noted  . S/P total knee arthroplasty, left 02/08/2017  . Protein-calorie malnutrition ([place]) 02/08/2017  . Primary localized osteoarthritis of left knee 01/05/2017  . Sinus bradycardia 07/05/2014    Past Surgical History:  Procedure Laterality Date  . ABDOMINAL HYSTERECTOMY    . BREAST BIOPSY Left 1980's  . BREAST SURGERY Left   . CATARACT EXTRACTION W/ INTRAOCULAR LENS  IMPLANT, BILATERAL    .  CHOLECYSTECTOMY    . COLONOSCOPY WITH PROPOFOL N/A 01/14/2015   Procedure: COLONOSCOPY WITH PROPOFOL;  Surgeon: Manya Silvas, MD;  Location: [facility] ENDOSCOPY;  Service: Endoscopy;  Laterality: N/A;  . ESOPHAGOGASTRODUODENOSCOPY (EGD) WITH PROPOFOL N/A 01/14/2015   Procedure: ESOPHAGOGASTRODUODENOSCOPY (EGD) WITH PROPOFOL;  Surgeon: Manya Silvas, MD;  Location: [facility] ENDOSCOPY;  Service: Endoscopy;  Laterality: N/A;  . EYE SURGERY    . HERNIA REPAIR  2010   with mesh  . INSERT / REPLACE / REMOVE PACEMAKER  03/2014   Dr. Saralyn Pilar  . JOINT REPLACEMENT Left    Left hip replacement  . PACEMAKER LEAD REMOVAL N/A 07/05/2014   Procedure: Pacemaker insertion ;  Surgeon: Isaias Cowman, MD;  Location: [facility] ORS;  Service: Cardiovascular;  Laterality: N/A;  . TONSILLECTOMY    . TOTAL KNEE ARTHROPLASTY Left 01/05/2017   Procedure: TOTAL KNEE ARTHROPLASTY;  Surgeon: Hessie Knows, MD;  Location: [facility] ORS;  Service: Orthopedics;  Laterality: Left;    Prior to Admission medications   Medication Sig Start Date End Date Taking? Authorizing Provider  acetaminophen (TYLENOL) 500 MG tablet Take 1-2 tablets (500-1,000 mg total) every 6 (six) hours as needed by mouth for moderate pain. 01/08/17   Duanne Guess, PA-C  allopurinol (ZYLOPRIM) 300 MG tablet Take 300 mg by mouth daily.    [provider]  amLODipine (NORVASC) 10 MG tablet Take 1 tablet (10 mg total) daily by mouth. 01/08/17  Duanne Guess, PA-C  aspirin EC 81 MG tablet Take 81 mg by mouth daily.    [provider]  Gennette Pac Take 1 packet by mouth daily. Mix 1 packet in with beverage or food of choice for protein supplementation    [provider]  CALCIUM-MAGNESIUM-ZINC PO Take 1 tablet by mouth daily.    [provider]  Cholecalciferol (VITAMIN D3) 2000 units TABS Take 2,000 Units by mouth daily.    [provider]  Cyanocobalamin 1000 MCG/ML LIQD Take 1 mL by mouth every other  day.     [provider]  docusate sodium (COLACE) 100 MG capsule Take 100 mg by mouth 2 (two) times daily.     [provider]  EPINEPHrine 0.3 mg/0.3 mL IJ SOAJ injection Inject 0.3 mg into the muscle as needed. Inject into outer thigh muscle with any signs of anaphylactic allergic reaction. Then send to the ED for evaluation    [provider]  furosemide (LASIX) 20 MG tablet Take 20 mg by mouth daily.    [provider]  gabapentin (NEURONTIN) 100 MG capsule Take 100 mg by mouth 3 (three) times daily.     [provider]  losartan (COZAAR) 25 MG tablet Take 1 tablet (25 mg total) daily by mouth. 01/08/17   Duanne Guess, PA-C  methocarbamol (ROBAXIN) 500 MG tablet Take 500 mg by mouth every 6 (six) hours as needed for muscle spasms.    [provider]  mirabegron ER (MYRBETRIQ) 50 MG TB24 tablet Take 50 mg by mouth daily.    [provider]  Multiple Vitamins-Minerals (CEROVITE SENIOR) TABS Take 1 tablet by mouth daily.    [provider]  Nutritional Supplements (ENSURE ENLIVE PO) Take 1 Bottle by mouth 2 (two) times daily between meals.    [provider]  omeprazole (PRILOSEC) 20 MG capsule Take 20 mg by mouth daily.     [provider]  oxyCODONE (OXY IR/ROXICODONE) 5 MG immediate release tablet Take 0.5-1 tablets (2.5-5 mg total) every 4 (four) hours as needed by mouth for moderate pain ((score 4 to 6)). 01/08/17   Duanne Guess, PA-C  sertraline (ZOLOFT) 50 MG tablet Take 50 mg by mouth daily.  11/09/16   [provider]  traMADol (ULTRAM) 50 MG tablet Take 1 tablet (50 mg total) by mouth every 4 (four) hours as needed. 01/12/17   Toni Arthurs, NP    Allergies Corn-containing products, Ace inhibitors, Angiotensin receptor blockers, Penicillins, Cefazolin, and Keflex [cephalexin]  Family History  Problem Relation Age of Onset  . Hypertension Mother   . Breast cancer Mother   .  Hypertension Father   . Stomach cancer Father   . Hypertension Sister   . Hypertension Brother   . Hypertension Other   . Diabetes Mellitus II Maternal Grandmother     Social History Social History   Tobacco Use  . Smoking status: Never Smoker  . Smokeless tobacco: Never Used  Substance Use Topics  . Alcohol use: No  . Drug use: No    Review of Systems  Constitutional: No fever. Eyes: No redness. ENT: No sore throat. Cardiovascular: Denies chest pain. Respiratory: Denies shortness of breath. Gastrointestinal: No vomiting or diarrhea.  Genitourinary: Negative for dysuria.  Musculoskeletal: Negative for back pain. Skin: Negative for rash. Neurological: Negative for headache.   ____________________________________________   PHYSICAL EXAM:  VITAL SIGNS: ED Triage Vitals [09/17/18 1518]  Enc Vitals Group  BP 105/86     Pulse Rate 95     Resp 14     Temp 98.5 F (36.9 C)     Temp Source Oral     SpO2 98 %     Weight 184 lb (83.5 kg)     Height      Head Circumference      Peak Flow      Pain Score 0     Pain Loc      Pain Edu?      Excl. in [place]?     Constitutional: Alert and oriented. Well appearing for age and in no acute distress. Eyes: Conjunctivae are normal.  Head: Atraumatic. Nose: No congestion/rhinnorhea. Mouth/Throat: Mucous membranes are moist.  Oropharynx with no swelling or pooled secretions. Neck: Normal range of motion.  Cardiovascular: Normal rate, regular rhythm.  Good peripheral circulation. Respiratory: Normal respiratory effort.  No retractions. Gastrointestinal:  No distention.  Musculoskeletal: Extremities warm and well perfused.  Neurologic:  Normal speech and language. No gross focal neurologic deficits are appreciated.  Skin:  Skin is warm and dry. No rash noted. Psychiatric: Mood and affect are normal. Speech and behavior are normal.  ____________________________________________   LABS (all labs ordered are listed, but  only abnormal results are displayed)  Labs Reviewed - No data to display ____________________________________________  EKG   ____________________________________________  RADIOLOGY    ____________________________________________   PROCEDURES  Procedure(s) performed: No  Procedures  Critical Care performed: No ____________________________________________   INITIAL IMPRESSION / ASSESSMENT AND PLAN / ED COURSE  Pertinent labs & imaging results that were available during my care of the patient were reviewed by me and considered in my medical decision making (see chart for details).  85 year old female with PMH as noted above presents with an apparent allergic reaction, acute onset around 1 PM today.  The patient is not sure what the reaction was her response to.  She reports diffuse hives and felt mild shortness of breath, but these have resolved.  The patient took Benadryl and an EpiPen prior to coming.  On exam she is well-appearing.  Her vital signs are normal.  Skin on her arms appears faintly erythematous but she has no hives, and the oropharynx is clear.  Overall presentation is consistent with an allergic reaction.  The patient got epinephrine about 2 hours ago.  I will observe her for an additional 1 to 2 hours although she states that she would like to go home as soon as possible.  I will also give a dose of Solu-Medrol.  ----------------------------------------- 5:18 PM on 09/17/2018 -----------------------------------------  The patient has now been in the ED for over 2 hours.  She has full resolution of her symptoms and states she feels well.  She would like to go home.  She is stable for discharge at this time.  Return precautions given, and she expresses understanding.  Patient has another EpiPen at home.  ____________________________________________   FINAL CLINICAL IMPRESSION(S) / ED DIAGNOSES  Final diagnoses:  Allergic reaction, initial encounter       NEW MEDICATIONS STARTED DURING THIS VISIT:  New Prescriptions   No medications on file     Note:  This document was prepared using Dragon voice recognition software and may include unintentional dictation errors.    Arta Silence, MD 09/17/18 1718

## 2018-09-17 NOTE — Discharge Instructions (Addendum)
Return to the ER for new, worsening, recurrent hives, swelling, tightness in your throat, difficulty breathing, or any other new or worsening symptoms that concern you.

## 2018-09-17 NOTE — ED Triage Notes (Signed)
Pt has hives all over per EMS report.

## 2018-11-24 IMAGING — CT CT CERVICAL SPINE W/O CM
3 of 4 series · 12 of 33 positions shown, 14 images · non-contrast
Comparison: None.

CLINICAL DATA: Pain following fall

EXAM:
CT CERVICAL SPINE WITHOUT CONTRAST
TECHNIQUE: Multidetector CT imaging of the cervical spine was performed without
intravenous contrast. Multiplanar CT image reconstructions were also
generated.

[Series 4: sagittal bone · sagittal · 0.27mm/px · 5 of 44 slices shown, 6 images]
[im 15/44  bone]
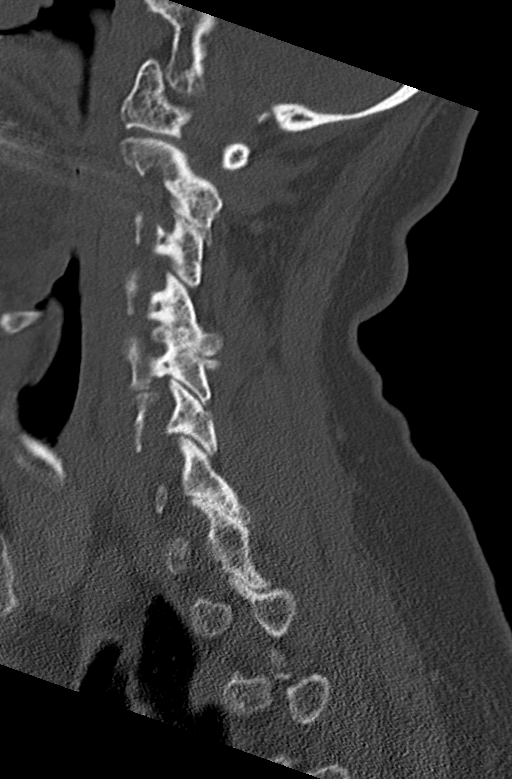
[im 18/44  bone]
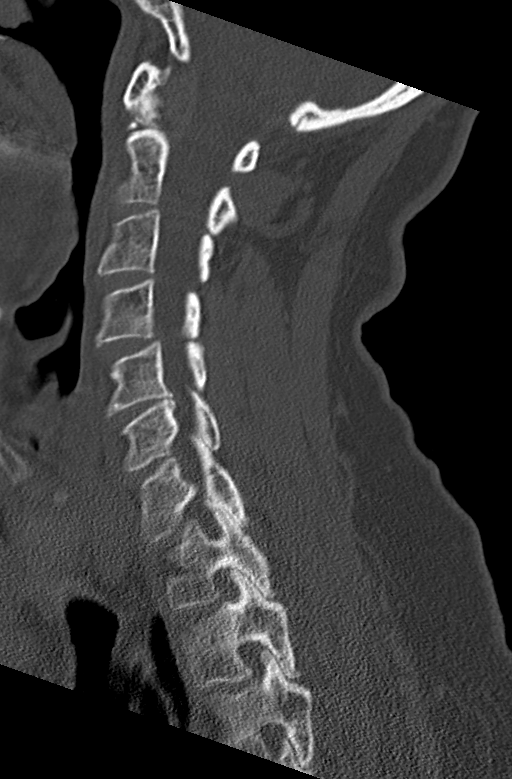
[im 22/44  soft-tissue]
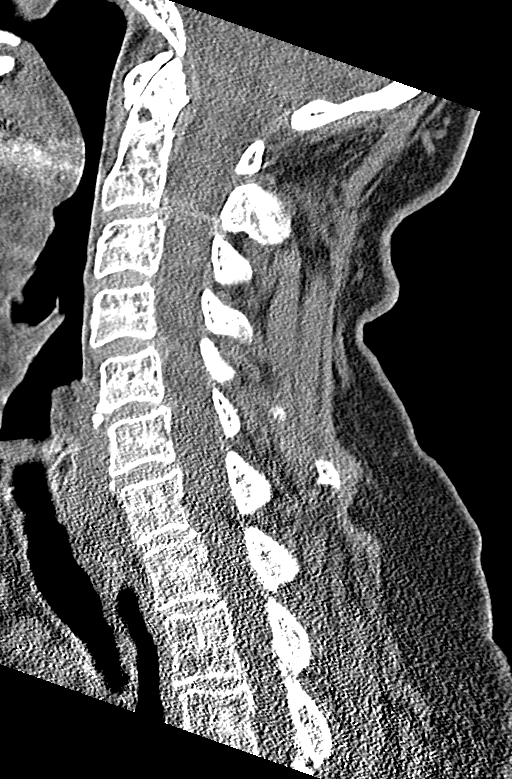
[im 22/44  bone]
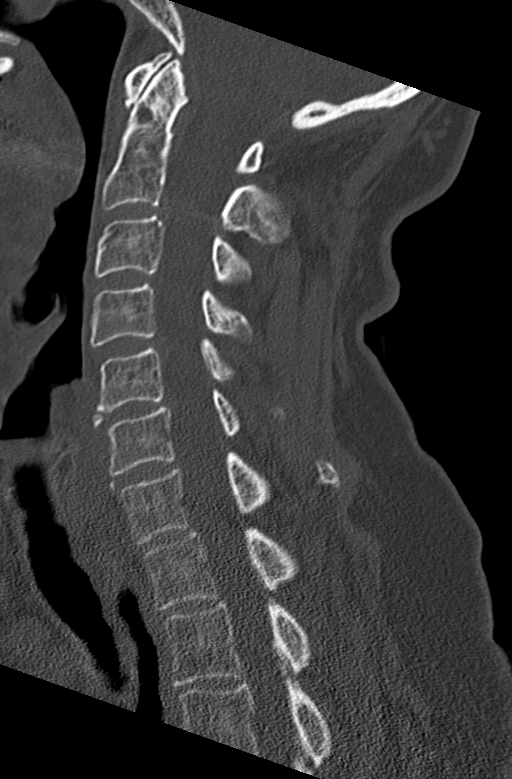
[im 26/44  bone]
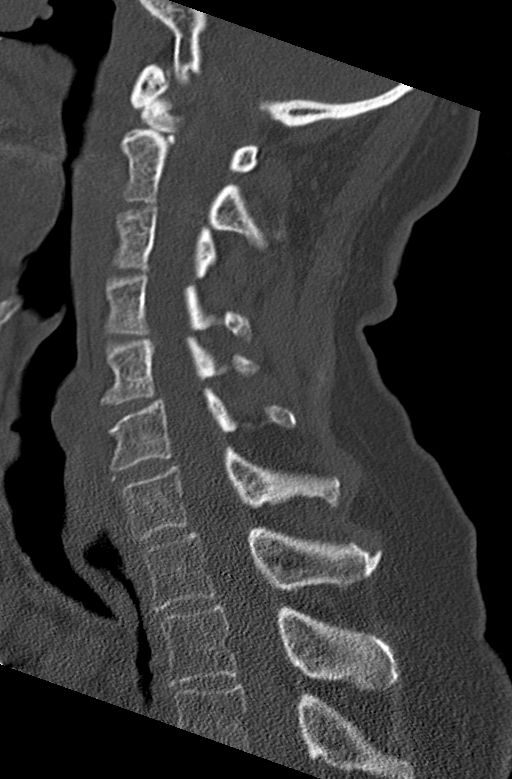
[im 29/44  bone]
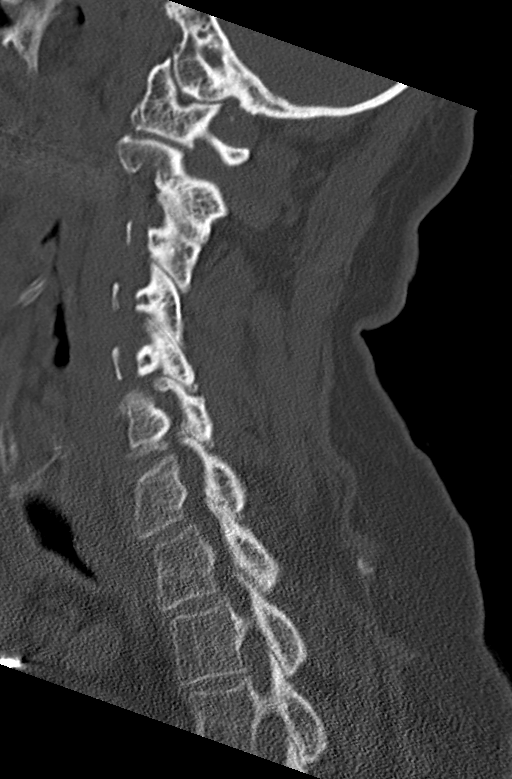

[Series 5: coronal bone · coronal · 0.27mm/px · 3 of 45 slices shown]
[im 9/45  bone]
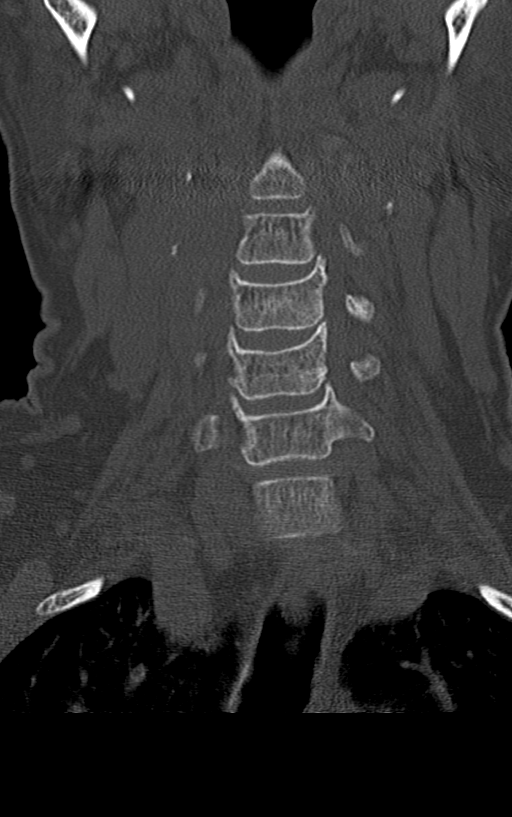
[im 18/45  bone]
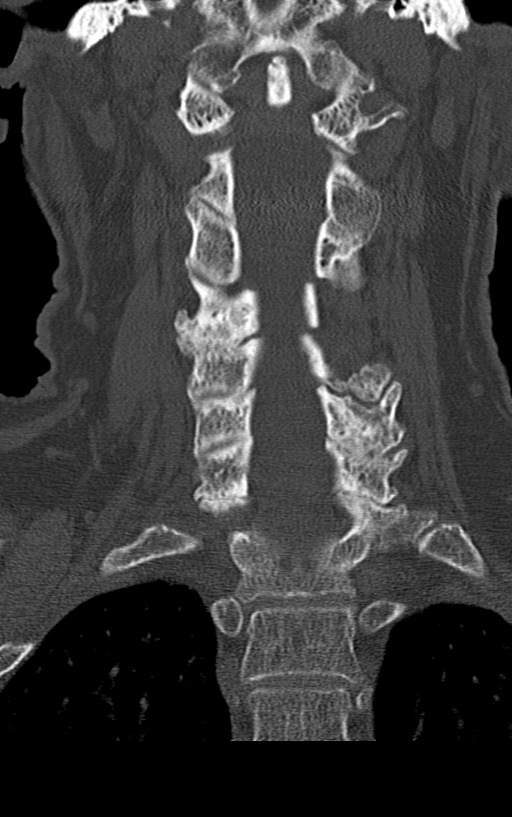
[im 27/45  bone]
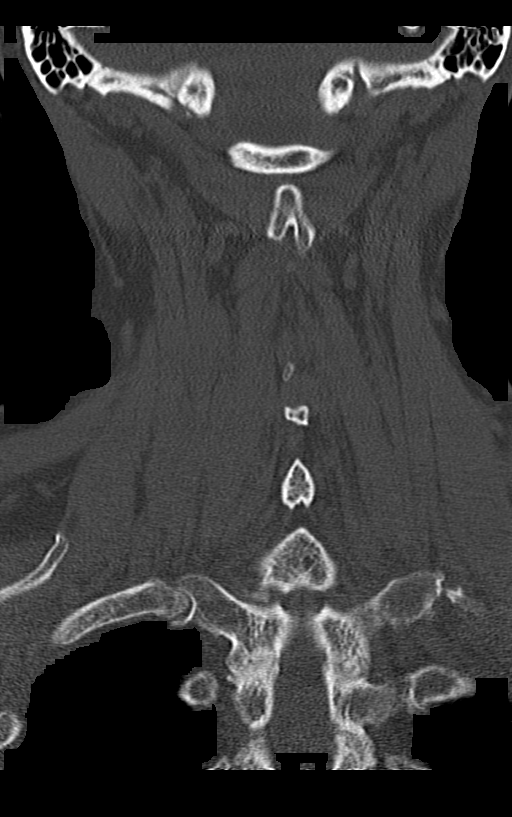

[Series 6: orthogonal bone · axial · 0.23mm/px · z∈[-287,-148]mm · 4 of 107 slices shown, 5 images]
[im 16/107  soft-tissue]
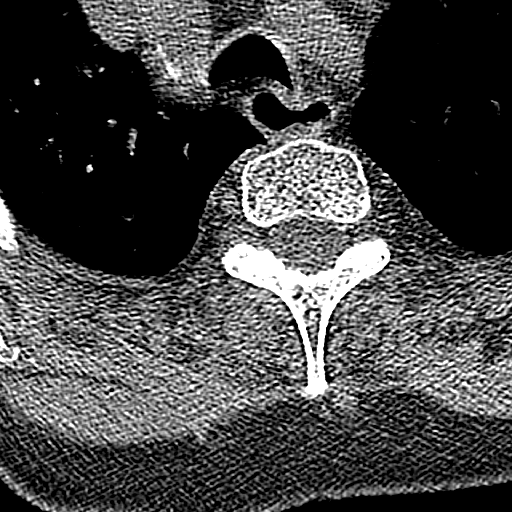
[im 16/107  bone]
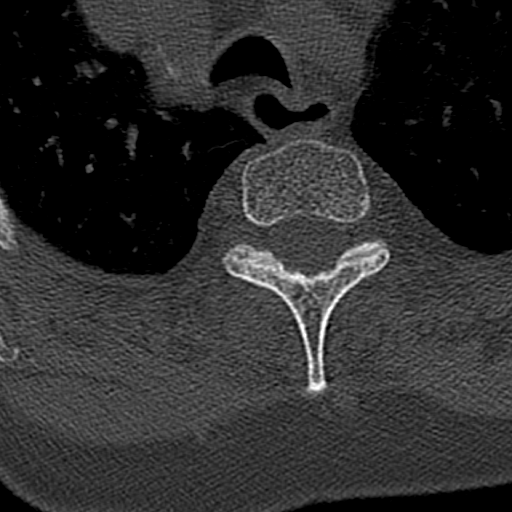
[im 46/107  bone]
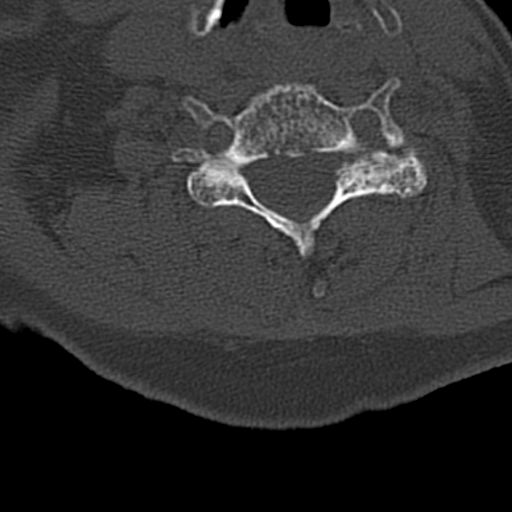
[im 61/107  bone]
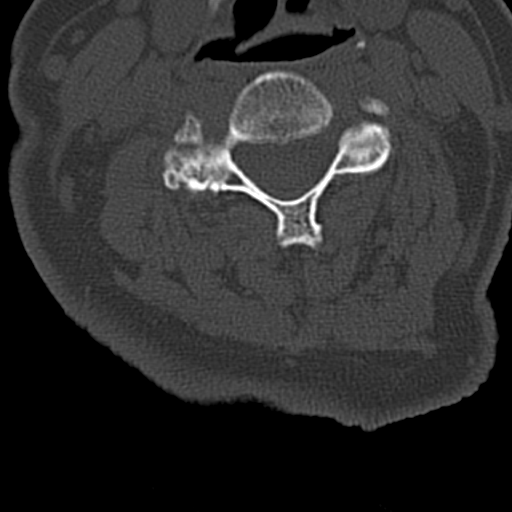
[im 91/107  bone]
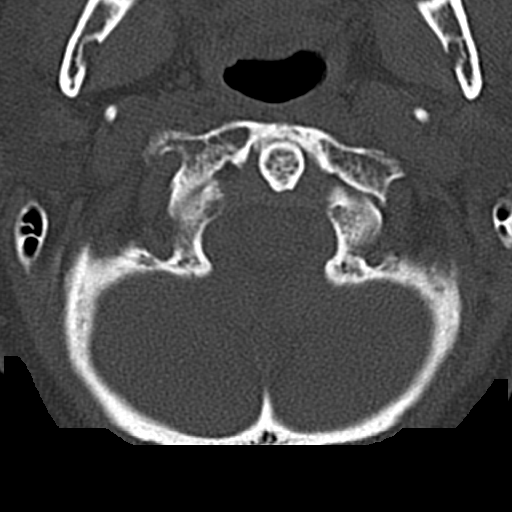

[12 of 33 positions shown; findings below may reference images not displayed]

FINDINGS: Alignment: No evident spondylolisthesis.

Skull base and vertebrae: Skull base and craniocervical junction
regions appear normal. Cystic changes are noted in the odontoid.
Bones appear osteoporotic. No evident fracture. There are no blastic
or lytic bone lesions.

Soft tissues and spinal canal: Prevertebral soft tissues and
predental space regions are normal. There is no paraspinous lesion.
There is no evident cord or canal hematoma.

Disc levels: There is mild disc space narrowing at C5-6 and C7-T1.
Other disc spaces appear normal. There are small anterior
osteophytes at C5 and C6. There are foci of calcification in the
anterior ligament at C5-6 and C6-7. There is facet osteoarthritic
change at multiple levels bilaterally. There is severe facet
osteoarthritic change on the right at C4-5 with impression on the
exiting nerve root on the right at C4-5 due to bony hypertrophy. A a
slightly less degree of facet osteoarthritic change impressing on
the exiting nerve root is noted at C5-6 on the right. There is
severe facet osteoarthritic change on the left at C5-6 without
appreciable exit foraminal narrowing. There is mild exit foraminal
narrowing at C6-7 on the left with severe bony hypertrophy in this
area. There is no frank disc extrusion or stenosis.

Upper chest: Visualized upper lung zones are clear except for a
calcified granuloma 1 in the posterior segment of the right upper
lobe.

Other: There is calcification in the right carotid artery as well as
in both carotid arteries. There are nodular opacities in the thyroid
consistent with multinodular goiter. Largest of these nodular
opacities is on the right measuring 8 mm.
IMPRESSION: 1.  No fracture or spondylolisthesis.

2. Multilevel osteoarthritic change. No frank disc extrusion or
stenosis. Areas of exit foraminal narrowing as noted above.

3. Areas of arterial vascular calcification in both carotid arteries
and right subclavian artery.

4.  Multinodular goiter without dominant mass.

## 2018-11-24 IMAGING — CT CT HEAD W/O CM
3 of 6 series · 15 of 47 positions shown, 18 images · non-contrast
Comparison: CT head 10/19/2013.

CLINICAL DATA: Tripped and fell hitting LEFT side of face. No
reported LOC.

EXAM:
CT HEAD WITHOUT CONTRAST
CT MAXILLOFACIAL WITHOUT CONTRAST
TECHNIQUE: Multidetector CT imaging of the head and maxillofacial structures
were performed using the standard protocol without intravenous
contrast. Multiplanar CT image reconstructions of the maxillofacial
structures were also generated.

[Series 6: max soft · axial · 0.33mm/px · z∈[-263,-121]mm · 10 of 83 slices shown, 13 images]
[im 8/83  brain]
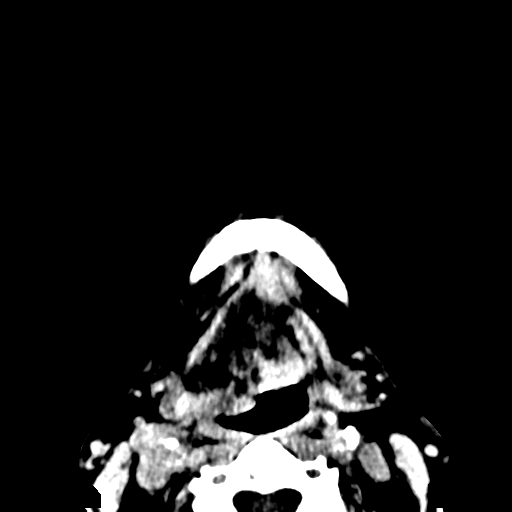
[im 8/83  bone]
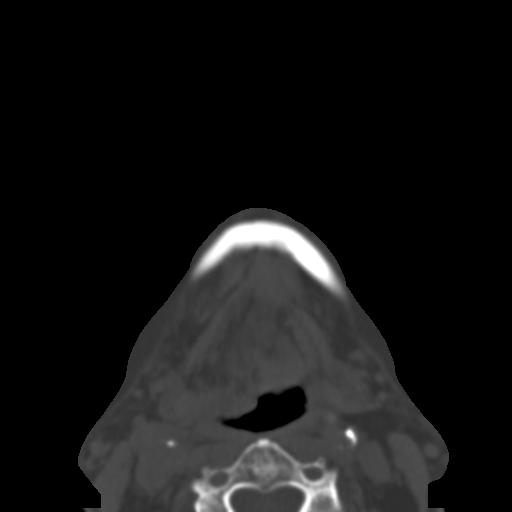
[im 16/83  brain]
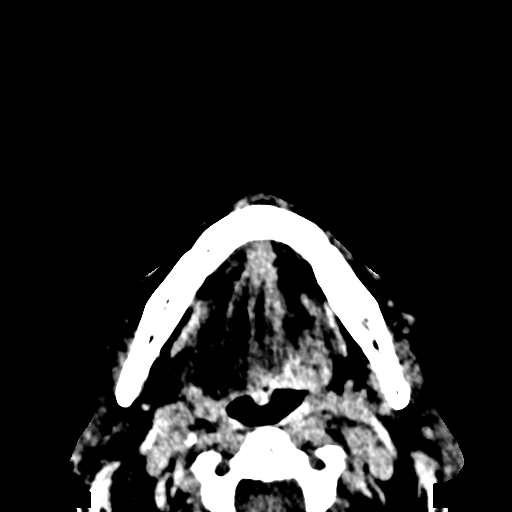
[im 24/83  brain]
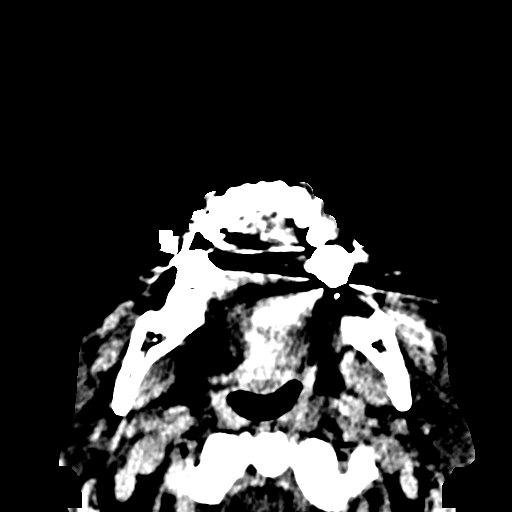
[im 32/83  brain]
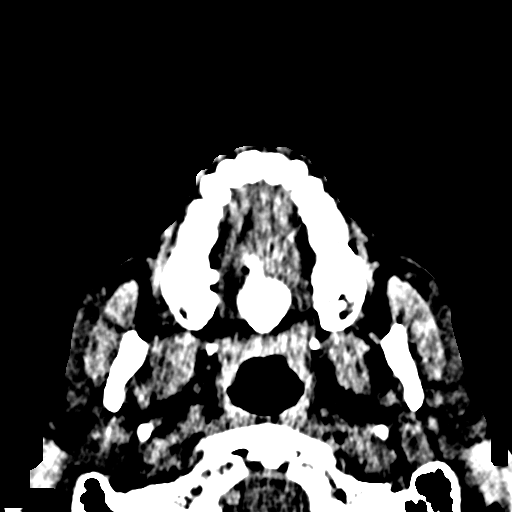
[im 40/83  brain]
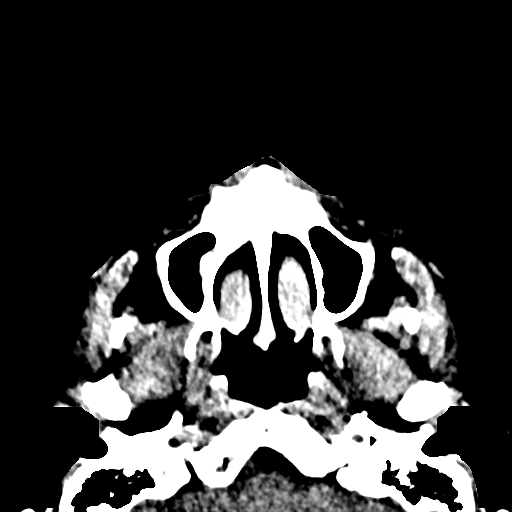
[im 40/83  bone]
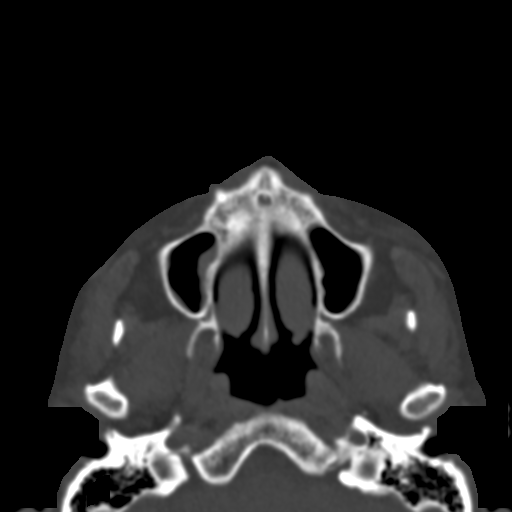
[im 47/83  brain]
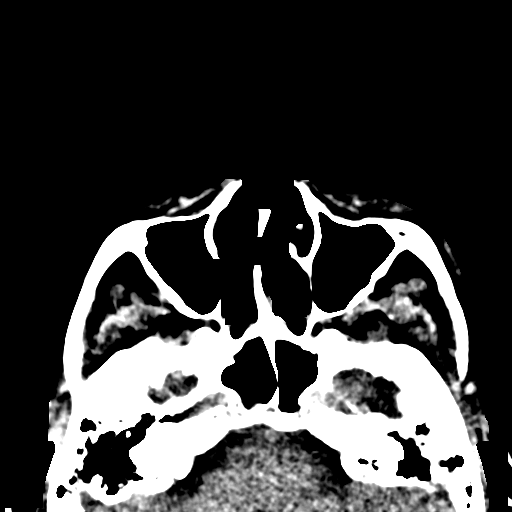
[im 55/83  brain]
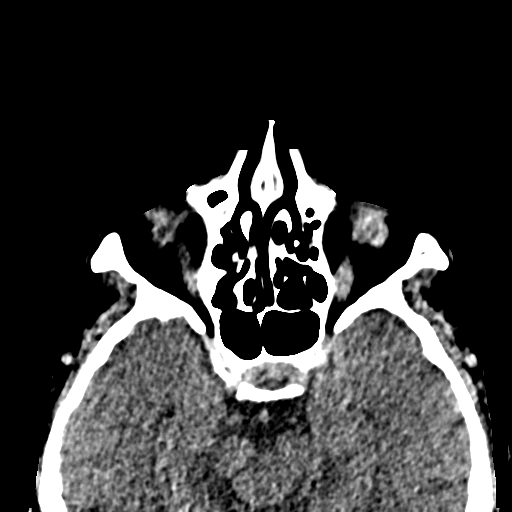
[im 63/83  brain]
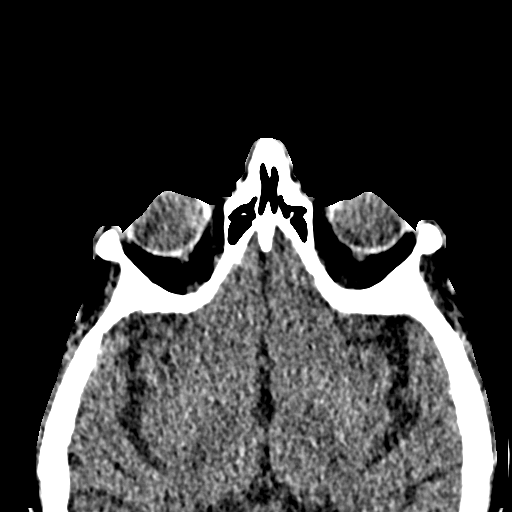
[im 71/83  brain]
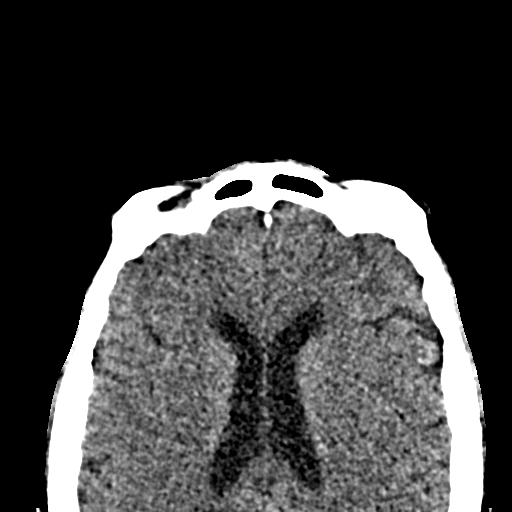
[im 71/83  bone]
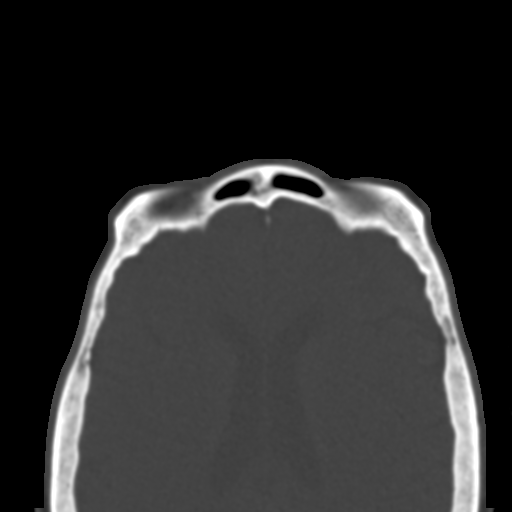
[im 79/83  brain]
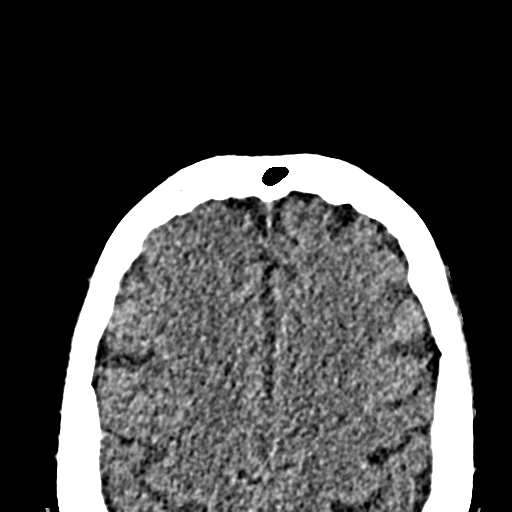

[Series 10: coronal soft · coronal · 0.31mm/px · 3 of 76 slices shown]
[im 35/76  brain]
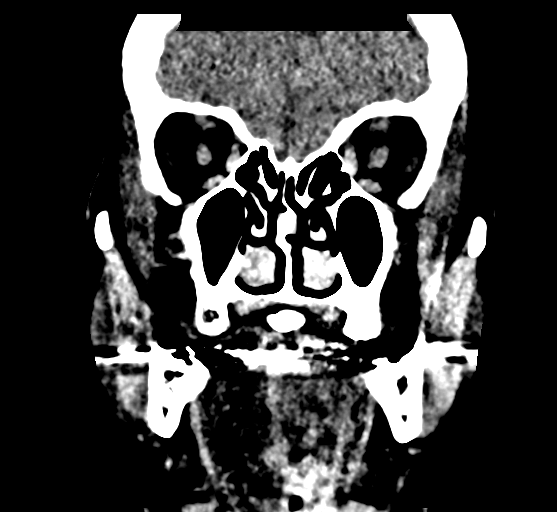
[im 45/76  brain]
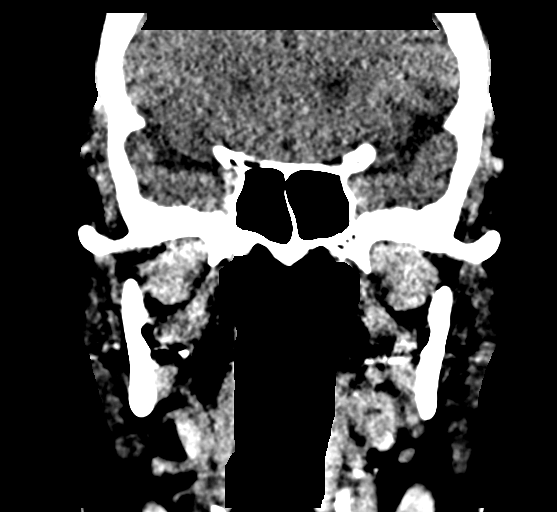
[im 55/76  brain]
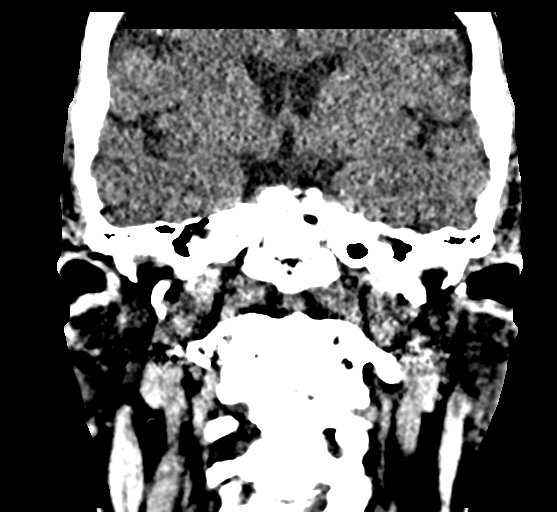

[Series 11: sagittal soft · sagittal · 0.31mm/px · 2 of 79 slices shown]
[im 27/79  brain]
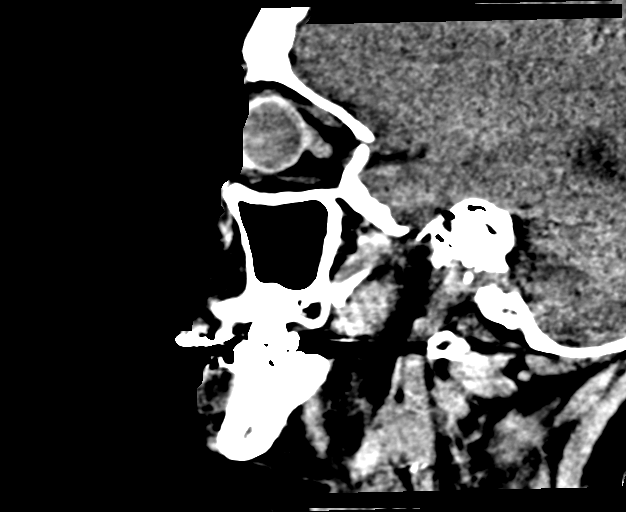
[im 53/79  brain]
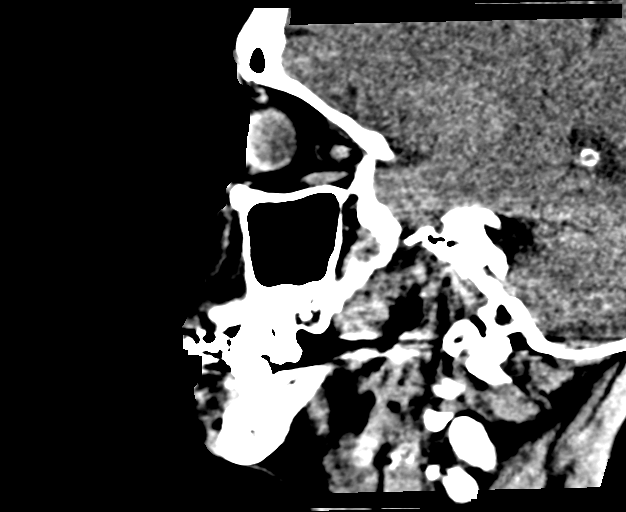

[15 of 47 positions shown; findings below may reference images not displayed]

FINDINGS: CT HEAD FINDINGS

Brain: No evidence for acute infarction, hemorrhage, mass lesion,
hydrocephalus, or extra-axial fluid. Normal for age cerebral volume.
Slight hypoattenuation of white matter, likely chronic microvascular
ischemic change.

Vascular: Calcification of the cavernous internal carotid arteries
consistent with cerebrovascular atherosclerotic disease. No signs of
intracranial large vessel occlusion.

Skull: No skull fracture.

Other: None.

CT MAXILLOFACIAL FINDINGS

Osseous: No fracture or mandibular dislocation. No destructive
process. [place] [REDACTED].

Orbits: Negative. No traumatic or inflammatory finding. BILATERAL
cataract extraction.

Sinuses: Chronic mucosal thickening. No layering fluid. No blowout
injury.

Soft tissues: LEFT facial soft tissue swelling.  No foreign body.
IMPRESSION: No skull fracture or intracranial hemorrhage.

No facial fracture or mandible dislocation.

LEFT facial soft tissue swelling.
